# Patient Record
Sex: Female | Born: 1975 | Race: White | Hispanic: No | Marital: Married | Smoking: Never smoker
Health system: Southern US, Community
[De-identification: ages and names within clinical notes are randomized; demographics above are authoritative.]

## PROBLEM LIST (undated history)

## (undated) DIAGNOSIS — E039 Hypothyroidism, unspecified: Secondary | ICD-10-CM

## (undated) HISTORY — PX: CHOLECYSTECTOMY: SHX55

---

## 2019-04-06 ENCOUNTER — Emergency Department (HOSPITAL_COMMUNITY): Payer: BC Managed Care – PPO

## 2019-04-06 ENCOUNTER — Other Ambulatory Visit: Payer: Self-pay

## 2019-04-06 ENCOUNTER — Emergency Department (HOSPITAL_COMMUNITY): Payer: BC Managed Care – PPO | Admitting: Anesthesiology

## 2019-04-06 ENCOUNTER — Encounter (HOSPITAL_COMMUNITY): Payer: Self-pay

## 2019-04-06 ENCOUNTER — Inpatient Hospital Stay (HOSPITAL_COMMUNITY): Payer: BC Managed Care – PPO

## 2019-04-06 ENCOUNTER — Inpatient Hospital Stay (HOSPITAL_COMMUNITY)
Admission: EM | Admit: 2019-04-06 | Discharge: 2019-04-21 | DRG: 957 | Disposition: A | Discharge status: Home Health | Payer: BC Managed Care – PPO | Attending: Orthopedic Surgery | Admitting: Orthopedic Surgery

## 2019-04-06 ENCOUNTER — Encounter (HOSPITAL_COMMUNITY): Admission: EM | Disposition: A | Discharge status: Home Health | Payer: Self-pay | Source: Home / Self Care

## 2019-04-06 DIAGNOSIS — S3692XA Contusion of unspecified intra-abdominal organ, initial encounter: Secondary | ICD-10-CM | POA: Diagnosis present

## 2019-04-06 DIAGNOSIS — D62 Acute posthemorrhagic anemia: Secondary | ICD-10-CM | POA: Diagnosis present

## 2019-04-06 DIAGNOSIS — Z4659 Encounter for fitting and adjustment of other gastrointestinal appliance and device: Secondary | ICD-10-CM

## 2019-04-06 DIAGNOSIS — K661 Hemoperitoneum: Secondary | ICD-10-CM | POA: Diagnosis present

## 2019-04-06 DIAGNOSIS — S42022A Displaced fracture of shaft of left clavicle, initial encounter for closed fracture: Secondary | ICD-10-CM

## 2019-04-06 DIAGNOSIS — E039 Hypothyroidism, unspecified: Secondary | ICD-10-CM | POA: Diagnosis present

## 2019-04-06 DIAGNOSIS — F419 Anxiety disorder, unspecified: Secondary | ICD-10-CM | POA: Diagnosis present

## 2019-04-06 DIAGNOSIS — Z419 Encounter for procedure for purposes other than remedying health state, unspecified: Secondary | ICD-10-CM

## 2019-04-06 DIAGNOSIS — S270XXA Traumatic pneumothorax, initial encounter: Secondary | ICD-10-CM

## 2019-04-06 DIAGNOSIS — J939 Pneumothorax, unspecified: Secondary | ICD-10-CM

## 2019-04-06 DIAGNOSIS — S82142A Displaced bicondylar fracture of left tibia, initial encounter for closed fracture: Secondary | ICD-10-CM | POA: Diagnosis present

## 2019-04-06 DIAGNOSIS — F430 Acute stress reaction: Secondary | ICD-10-CM | POA: Diagnosis present

## 2019-04-06 DIAGNOSIS — G5730 Lesion of lateral popliteal nerve, unspecified lower limb: Secondary | ICD-10-CM | POA: Diagnosis not present

## 2019-04-06 DIAGNOSIS — Z20828 Contact with and (suspected) exposure to other viral communicable diseases: Secondary | ICD-10-CM | POA: Diagnosis present

## 2019-04-06 DIAGNOSIS — J90 Pleural effusion, not elsewhere classified: Secondary | ICD-10-CM

## 2019-04-06 DIAGNOSIS — J9690 Respiratory failure, unspecified, unspecified whether with hypoxia or hypercapnia: Secondary | ICD-10-CM

## 2019-04-06 DIAGNOSIS — S060X9A Concussion with loss of consciousness of unspecified duration, initial encounter: Secondary | ICD-10-CM | POA: Diagnosis present

## 2019-04-06 DIAGNOSIS — R109 Unspecified abdominal pain: Secondary | ICD-10-CM | POA: Diagnosis present

## 2019-04-06 DIAGNOSIS — M238X9 Other internal derangements of unspecified knee: Secondary | ICD-10-CM

## 2019-04-06 DIAGNOSIS — S2242XA Multiple fractures of ribs, left side, initial encounter for closed fracture: Secondary | ICD-10-CM | POA: Diagnosis present

## 2019-04-06 DIAGNOSIS — S36116A Major laceration of liver, initial encounter: Secondary | ICD-10-CM | POA: Diagnosis present

## 2019-04-06 DIAGNOSIS — R52 Pain, unspecified: Secondary | ICD-10-CM

## 2019-04-06 DIAGNOSIS — N390 Urinary tract infection, site not specified: Secondary | ICD-10-CM | POA: Diagnosis not present

## 2019-04-06 DIAGNOSIS — Z9689 Presence of other specified functional implants: Secondary | ICD-10-CM

## 2019-04-06 DIAGNOSIS — M21372 Foot drop, left foot: Secondary | ICD-10-CM | POA: Diagnosis not present

## 2019-04-06 DIAGNOSIS — Y92410 Unspecified street and highway as the place of occurrence of the external cause: Secondary | ICD-10-CM

## 2019-04-06 DIAGNOSIS — Z90721 Acquired absence of ovaries, unilateral: Secondary | ICD-10-CM | POA: Diagnosis not present

## 2019-04-06 DIAGNOSIS — T148XXA Other injury of unspecified body region, initial encounter: Secondary | ICD-10-CM

## 2019-04-06 DIAGNOSIS — K117 Disturbances of salivary secretion: Secondary | ICD-10-CM

## 2019-04-06 DIAGNOSIS — Z885 Allergy status to narcotic agent status: Secondary | ICD-10-CM | POA: Diagnosis not present

## 2019-04-06 DIAGNOSIS — E876 Hypokalemia: Secondary | ICD-10-CM | POA: Diagnosis present

## 2019-04-06 DIAGNOSIS — J9601 Acute respiratory failure with hypoxia: Secondary | ICD-10-CM | POA: Diagnosis present

## 2019-04-06 DIAGNOSIS — E8889 Other specified metabolic disorders: Secondary | ICD-10-CM | POA: Diagnosis present

## 2019-04-06 DIAGNOSIS — D72829 Elevated white blood cell count, unspecified: Secondary | ICD-10-CM

## 2019-04-06 DIAGNOSIS — S2249XA Multiple fractures of ribs, unspecified side, initial encounter for closed fracture: Secondary | ICD-10-CM | POA: Diagnosis present

## 2019-04-06 DIAGNOSIS — E559 Vitamin D deficiency, unspecified: Secondary | ICD-10-CM | POA: Diagnosis present

## 2019-04-06 DIAGNOSIS — M7989 Other specified soft tissue disorders: Secondary | ICD-10-CM | POA: Diagnosis not present

## 2019-04-06 DIAGNOSIS — M2480 Other specific joint derangements of unspecified joint, not elsewhere classified: Secondary | ICD-10-CM

## 2019-04-06 DIAGNOSIS — R58 Hemorrhage, not elsewhere classified: Secondary | ICD-10-CM

## 2019-04-06 HISTORY — PX: LAPAROTOMY: SHX154

## 2019-04-06 HISTORY — DX: Hypothyroidism, unspecified: E03.9

## 2019-04-06 LAB — LACTIC ACID, PLASMA
Lactic Acid, Venous: 2.4 mmol/L (ref 0.5–1.9)
Lactic Acid, Venous: 2.5 mmol/L (ref 0.5–1.9)

## 2019-04-06 LAB — COMPREHENSIVE METABOLIC PANEL
ALT: 528 U/L — ABNORMAL HIGH (ref 0–44)
AST: 518 U/L — ABNORMAL HIGH (ref 15–41)
Albumin: 3.7 g/dL (ref 3.5–5.0)
Alkaline Phosphatase: 61 U/L (ref 38–126)
Anion gap: 12 (ref 5–15)
BUN: 17 mg/dL (ref 6–20)
CO2: 21 mmol/L — ABNORMAL LOW (ref 22–32)
Calcium: 9.1 mg/dL (ref 8.9–10.3)
Chloride: 105 mmol/L (ref 98–111)
Creatinine, Ser: 0.84 mg/dL (ref 0.44–1.00)
GFR calc Af Amer: >60 mL/min (ref >60)
GFR calc non Af Amer: >60 mL/min (ref >60)
Glucose, Bld: 190 mg/dL — ABNORMAL HIGH (ref 70–99)
Potassium: 3.7 mmol/L (ref 3.5–5.1)
Sodium: 138 mmol/L (ref 135–145)
Total Bilirubin: 0.7 mg/dL (ref 0.3–1.2)
Total Protein: 6.2 g/dL — ABNORMAL LOW (ref 6.5–8.1)

## 2019-04-06 LAB — PREPARE FRESH FROZEN PLASMA
Unit division: 0
Unit division: 0
Unit division: 0
Unit division: 0
Unit division: 0
Unit division: 0
Unit division: 0
Unit division: 0

## 2019-04-06 LAB — BASIC METABOLIC PANEL
Anion gap: 7 (ref 5–15)
BUN: 13 mg/dL (ref 6–20)
CO2: 21 mmol/L — ABNORMAL LOW (ref 22–32)
Calcium: 7.6 mg/dL — ABNORMAL LOW (ref 8.9–10.3)
Chloride: 109 mmol/L (ref 98–111)
Creatinine, Ser: 0.56 mg/dL (ref 0.44–1.00)
GFR calc Af Amer: >60 mL/min (ref >60)
GFR calc non Af Amer: >60 mL/min (ref >60)
Glucose, Bld: 199 mg/dL — ABNORMAL HIGH (ref 70–99)
Potassium: 3.7 mmol/L (ref 3.5–5.1)
Sodium: 137 mmol/L (ref 135–145)

## 2019-04-06 LAB — URINALYSIS, ROUTINE W REFLEX MICROSCOPIC
Bacteria, UA: NONE SEEN
Bilirubin Urine: NEGATIVE
Glucose, UA: 150 mg/dL — AB
Ketones, ur: NEGATIVE mg/dL
Leukocytes,Ua: NEGATIVE
Nitrite: NEGATIVE
Protein, ur: NEGATIVE mg/dL
Specific Gravity, Urine: 1.016 (ref 1.005–1.030)
pH: 8 (ref 5.0–8.0)

## 2019-04-06 LAB — SARS CORONAVIRUS 2 BY RT PCR (HOSPITAL ORDER, PERFORMED IN ~~LOC~~ HOSPITAL LAB): SARS Coronavirus 2: NEGATIVE

## 2019-04-06 LAB — I-STAT CHEM 8, ED
BUN: 20 mg/dL (ref 6–20)
Calcium, Ion: 1.19 mmol/L (ref 1.15–1.40)
Chloride: 106 mmol/L (ref 98–111)
Creatinine, Ser: 0.7 mg/dL (ref 0.44–1.00)
Glucose, Bld: 179 mg/dL — ABNORMAL HIGH (ref 70–99)
HCT: 40 % (ref 36.0–46.0)
Hemoglobin: 13.6 g/dL (ref 12.0–15.0)
Potassium: 3.7 mmol/L (ref 3.5–5.1)
Sodium: 140 mmol/L (ref 135–145)
TCO2: 24 mmol/L (ref 22–32)

## 2019-04-06 LAB — CBC
HCT: 39.8 % (ref 36.0–46.0)
HCT: 44 % (ref 36.0–46.0)
HCT: 44.4 % (ref 36.0–46.0)
HCT: 41.5 % (ref 36.0–46.0)
Hemoglobin: 13.7 g/dL (ref 12.0–15.0)
Hemoglobin: 15 g/dL (ref 12.0–15.0)
Hemoglobin: 15.4 g/dL — ABNORMAL HIGH (ref 12.0–15.0)
Hemoglobin: 14 g/dL (ref 12.0–15.0)
MCH: 31.6 pg (ref 26.0–34.0)
MCH: 30.7 pg (ref 26.0–34.0)
MCH: 31.2 pg (ref 26.0–34.0)
MCH: 31 pg (ref 26.0–34.0)
MCHC: 34.4 g/dL (ref 30.0–36.0)
MCHC: 34.1 g/dL (ref 30.0–36.0)
MCHC: 34.7 g/dL (ref 30.0–36.0)
MCHC: 33.7 g/dL (ref 30.0–36.0)
MCV: 91.9 fL (ref 80.0–100.0)
MCV: 90 fL (ref 80.0–100.0)
MCV: 90.1 fL (ref 80.0–100.0)
MCV: 92 fL (ref 80.0–100.0)
Platelets: 404 10*3/uL — ABNORMAL HIGH (ref 150–400)
Platelets: 158 10*3/uL (ref 150–400)
Platelets: 203 10*3/uL (ref 150–400)
Platelets: 225 10*3/uL (ref 150–400)
RBC: 4.33 MIL/uL (ref 3.87–5.11)
RBC: 4.89 MIL/uL (ref 3.87–5.11)
RBC: 4.93 MIL/uL (ref 3.87–5.11)
RBC: 4.51 MIL/uL (ref 3.87–5.11)
RDW: 12.2 % (ref 11.5–15.5)
RDW: 12.7 % (ref 11.5–15.5)
RDW: 13 % (ref 11.5–15.5)
RDW: 13.4 % (ref 11.5–15.5)
WBC: 11.1 10*3/uL — ABNORMAL HIGH (ref 4.0–10.5)
WBC: 11.1 10*3/uL — ABNORMAL HIGH (ref 4.0–10.5)
WBC: 12.9 10*3/uL — ABNORMAL HIGH (ref 4.0–10.5)
WBC: 13.7 10*3/uL — ABNORMAL HIGH (ref 4.0–10.5)
nRBC: 0 % (ref 0.0–0.2)
nRBC: 0 % (ref 0.0–0.2)
nRBC: 0 % (ref 0.0–0.2)
nRBC: 0 % (ref 0.0–0.2)

## 2019-04-06 LAB — POCT I-STAT 7, (LYTES, BLD GAS, ICA,H+H)
Acid-base deficit: 4 mmol/L — ABNORMAL HIGH (ref 0.0–2.0)
Acid-base deficit: 6 mmol/L — ABNORMAL HIGH (ref 0.0–2.0)
Bicarbonate: 22.7 mmol/L (ref 20.0–28.0)
Bicarbonate: 20.1 mmol/L (ref 20.0–28.0)
Calcium, Ion: 1.07 mmol/L — ABNORMAL LOW (ref 1.15–1.40)
Calcium, Ion: 1.2 mmol/L (ref 1.15–1.40)
HCT: 36 % (ref 36.0–46.0)
HCT: 46 % (ref 36.0–46.0)
Hemoglobin: 12.2 g/dL (ref 12.0–15.0)
Hemoglobin: 15.6 g/dL — ABNORMAL HIGH (ref 12.0–15.0)
O2 Saturation: 100 %
O2 Saturation: 99 %
Patient temperature: 35
Patient temperature: 34.1
Potassium: 4 mmol/L (ref 3.5–5.1)
Potassium: 3.5 mmol/L (ref 3.5–5.1)
Sodium: 140 mmol/L (ref 135–145)
Sodium: 140 mmol/L (ref 135–145)
TCO2: 24 mmol/L (ref 22–32)
TCO2: 21 mmol/L — ABNORMAL LOW (ref 22–32)
pCO2 arterial: 43.3 mmHg (ref 32.0–48.0)
pCO2 arterial: 34.9 mmHg (ref 32.0–48.0)
pH, Arterial: 7.318 — ABNORMAL LOW (ref 7.350–7.450)
pH, Arterial: 7.356 (ref 7.350–7.450)
pO2, Arterial: 522 mmHg — ABNORMAL HIGH (ref 83.0–108.0)
pO2, Arterial: 134 mmHg — ABNORMAL HIGH (ref 83.0–108.0)

## 2019-04-06 LAB — BPAM FFP
Blood Product Expiration Date: 202008312359
Blood Product Expiration Date: 202008312359
Blood Product Expiration Date: 202009012359
Blood Product Expiration Date: 202009012359
Blood Product Expiration Date: 202009012359
Blood Product Expiration Date: 202009012359
Blood Product Expiration Date: 202009012359
Blood Product Expiration Date: 202009012359
ISSUE DATE / TIME: 202008300125
ISSUE DATE / TIME: 202008300314
ISSUE DATE / TIME: 202008300314
ISSUE DATE / TIME: 202008301434
ISSUE DATE / TIME: 202008301505
ISSUE DATE / TIME: 202008301629
Unit Type and Rh: 600
Unit Type and Rh: 600
Unit Type and Rh: 6200
Unit Type and Rh: 6200
Unit Type and Rh: 6200
Unit Type and Rh: 6200
Unit Type and Rh: 6200
Unit Type and Rh: 6200

## 2019-04-06 LAB — I-STAT BETA HCG BLOOD, ED (MC, WL, AP ONLY): I-stat hCG, quantitative: <5 m[IU]/mL (ref <5)

## 2019-04-06 LAB — PROTIME-INR
INR: 1 (ref 0.8–1.2)
INR: 1.2 (ref 0.8–1.2)
Prothrombin Time: 13.3 seconds (ref 11.4–15.2)
Prothrombin Time: 14.7 s (ref 11.4–15.2)

## 2019-04-06 LAB — ABO/RH: ABO/RH(D): O POS

## 2019-04-06 LAB — ETHANOL: Alcohol, Ethyl (B): <10 mg/dL (ref <10)

## 2019-04-06 LAB — CDS SEROLOGY

## 2019-04-06 LAB — MRSA PCR SCREENING: MRSA by PCR: NEGATIVE

## 2019-04-06 LAB — HIV ANTIBODY (ROUTINE TESTING W REFLEX): HIV Screen 4th Generation wRfx: NONREACTIVE

## 2019-04-06 SURGERY — LAPAROTOMY, EXPLORATORY
Anesthesia: General | Site: Abdomen

## 2019-04-06 MED ORDER — FENTANYL CITRATE (PF) 100 MCG/2ML IJ SOLN: 50.0000 ug | INTRAMUSCULAR | Status: DC | PRN

## 2019-04-06 MED ORDER — PHENYLEPHRINE HCL-NACL 10-0.9 MG/250ML-% IV SOLN
25.0000 ug/min | INTRAVENOUS | Status: DC
  Administered 2019-04-06: 75 ug/min via INTRAVENOUS
  Administered 2019-04-06: 23:00:00 70 ug/min via INTRAVENOUS
  Administered 2019-04-06: 25 ug/min via INTRAVENOUS
  Administered 2019-04-07: 50 ug/min via INTRAVENOUS
  Administered 2019-04-07 (×2): 60 ug/min via INTRAVENOUS
  Administered 2019-04-07: 40 ug/min via INTRAVENOUS
  Filled 2019-04-06 (×7): qty 250

## 2019-04-06 MED ORDER — ONDANSETRON 4 MG PO TBDP
4.0000 mg | ORAL_TABLET | Freq: Four times a day (QID) | ORAL | Status: DC | PRN
  Administered 2019-04-21: 4 mg via ORAL
  Filled 2019-04-06: qty 1

## 2019-04-06 MED ORDER — SODIUM CHLORIDE 0.9 % IV BOLUS
1000.0000 mL | Freq: Once | INTRAVENOUS | Status: AC
  Administered 2019-04-06: 1000 mL via INTRAVENOUS

## 2019-04-06 MED ORDER — MIDAZOLAM HCL 2 MG/2ML IJ SOLN
INTRAMUSCULAR | Status: AC
  Filled 2019-04-06: qty 2

## 2019-04-06 MED ORDER — ONDANSETRON HCL 4 MG/2ML IJ SOLN
4.0000 mg | Freq: Four times a day (QID) | INTRAMUSCULAR | Status: DC | PRN
  Administered 2019-04-16 – 2019-04-21 (×4): 4 mg via INTRAVENOUS
  Filled 2019-04-06 (×4): qty 2

## 2019-04-06 MED ORDER — SODIUM CHLORIDE 0.9 % IV SOLN
INTRAVENOUS | Status: DC | PRN
  Administered 2019-04-06: 04:00:00 via INTRAVENOUS

## 2019-04-06 MED ORDER — ORAL CARE MOUTH RINSE
15.0000 mL | OROMUCOSAL | Status: DC
  Administered 2019-04-06 – 2019-04-16 (×100): 15 mL via OROMUCOSAL

## 2019-04-06 MED ORDER — KETAMINE HCL 10 MG/ML IJ SOLN: 1.0000 mg/kg | Freq: Once | INTRAMUSCULAR | Status: DC

## 2019-04-06 MED ORDER — LACTATED RINGERS IV SOLN
INTRAVENOUS | Status: DC
  Administered 2019-04-06 – 2019-04-13 (×15): via INTRAVENOUS

## 2019-04-06 MED ORDER — PROPOFOL 10 MG/ML IV BOLUS
INTRAVENOUS | Status: AC
  Filled 2019-04-06: qty 40

## 2019-04-06 MED ORDER — PROPOFOL 10 MG/ML IV BOLUS
INTRAVENOUS | Status: DC | PRN
  Administered 2019-04-06: 150 mg via INTRAVENOUS

## 2019-04-06 MED ORDER — CHLORHEXIDINE GLUCONATE CLOTH 2 % EX PADS: 6.0000 | MEDICATED_PAD | Freq: Every day | CUTANEOUS | Status: DC

## 2019-04-06 MED ORDER — BISACODYL 10 MG RE SUPP
10.0000 mg | Freq: Every day | RECTAL | Status: DC | PRN
  Administered 2019-04-11 – 2019-04-14 (×2): 10 mg via RECTAL
  Filled 2019-04-06 (×2): qty 1

## 2019-04-06 MED ORDER — CHLORHEXIDINE GLUCONATE 0.12% ORAL RINSE (MEDLINE KIT)
15.0000 mL | Freq: Two times a day (BID) | OROMUCOSAL | Status: DC
  Administered 2019-04-06 – 2019-04-20 (×28): 15 mL via OROMUCOSAL

## 2019-04-06 MED ORDER — LACTATED RINGERS IV SOLN
INTRAVENOUS | Status: DC | PRN
  Administered 2019-04-06 (×2): via INTRAVENOUS

## 2019-04-06 MED ORDER — PROPOFOL 1000 MG/100ML IV EMUL
INTRAVENOUS | Status: AC
  Filled 2019-04-06: qty 100

## 2019-04-06 MED ORDER — FENTANYL 2500MCG IN NS 250ML (10MCG/ML) PREMIX INFUSION
0.0000 ug/h | INTRAVENOUS | Status: DC
  Administered 2019-04-06: 25 ug/h via INTRAVENOUS
  Administered 2019-04-06: 200 ug/h via INTRAVENOUS
  Administered 2019-04-07: 250 ug/h via INTRAVENOUS
  Administered 2019-04-08: 100 ug/h via INTRAVENOUS
  Filled 2019-04-06 (×5): qty 250

## 2019-04-06 MED ORDER — IOHEXOL 300 MG/ML  SOLN
100.0000 mL | Freq: Once | INTRAMUSCULAR | Status: AC | PRN
  Administered 2019-04-06: 100 mL via INTRAVENOUS

## 2019-04-06 MED ORDER — PROPOFOL 1000 MG/100ML IV EMUL
0.0000 ug/kg/min | INTRAVENOUS | Status: DC
  Administered 2019-04-06: 20 ug/kg/min via INTRAVENOUS
  Administered 2019-04-06: 35 ug/kg/min via INTRAVENOUS
  Administered 2019-04-06: 40 ug/kg/min via INTRAVENOUS
  Administered 2019-04-07: 16:00:00 via INTRAVENOUS
  Administered 2019-04-07: 30 ug/kg/min via INTRAVENOUS
  Administered 2019-04-07: 50 ug/kg/min via INTRAVENOUS
  Administered 2019-04-07 (×2): 40 ug/kg/min via INTRAVENOUS
  Administered 2019-04-08: 04:00:00 35 ug/kg/min via INTRAVENOUS
  Filled 2019-04-06 (×8): qty 100

## 2019-04-06 MED ORDER — FENTANYL CITRATE (PF) 250 MCG/5ML IJ SOLN
INTRAMUSCULAR | Status: DC | PRN
  Administered 2019-04-06: 100 ug via INTRAVENOUS
  Administered 2019-04-06: 50 ug via INTRAVENOUS
  Administered 2019-04-06: 100 ug via INTRAVENOUS

## 2019-04-06 MED ORDER — PHENYLEPHRINE HCL (PRESSORS) 10 MG/ML IV SOLN
INTRAVENOUS | Status: DC | PRN
  Administered 2019-04-06 (×2): 120 ug via INTRAVENOUS

## 2019-04-06 MED ORDER — LACTATED RINGERS IV SOLN
INTRAVENOUS | Status: DC | PRN
  Administered 2019-04-06: 03:00:00 via INTRAVENOUS

## 2019-04-06 MED ORDER — ROCURONIUM BROMIDE 100 MG/10ML IV SOLN
INTRAVENOUS | Status: DC | PRN
  Administered 2019-04-06: 40 mg via INTRAVENOUS
  Administered 2019-04-06: 50 mg via INTRAVENOUS
  Administered 2019-04-06: 60 mg via INTRAVENOUS

## 2019-04-06 MED ORDER — MIDAZOLAM HCL 5 MG/5ML IJ SOLN
INTRAMUSCULAR | Status: DC | PRN
  Administered 2019-04-06: 2 mg via INTRAVENOUS

## 2019-04-06 MED ORDER — KETAMINE HCL 50 MG/5ML IJ SOSY
PREFILLED_SYRINGE | INTRAMUSCULAR | Status: AC
  Administered 2019-04-06: 50 mg
  Filled 2019-04-06: qty 5

## 2019-04-06 MED ORDER — FENTANYL CITRATE (PF) 250 MCG/5ML IJ SOLN
INTRAMUSCULAR | Status: AC
  Filled 2019-04-06: qty 5

## 2019-04-06 MED ORDER — CHLORHEXIDINE GLUCONATE CLOTH 2 % EX PADS
6.0000 | MEDICATED_PAD | Freq: Every day | CUTANEOUS | Status: DC
  Administered 2019-04-06 – 2019-04-20 (×12): 6 via TOPICAL

## 2019-04-06 MED ORDER — FENTANYL CITRATE (PF) 100 MCG/2ML IJ SOLN
INTRAMUSCULAR | Status: AC
  Administered 2019-04-06: 50 ug
  Filled 2019-04-06: qty 2

## 2019-04-06 MED ORDER — PANTOPRAZOLE SODIUM 40 MG IV SOLR
40.0000 mg | INTRAVENOUS | Status: DC
  Administered 2019-04-06 – 2019-04-08 (×3): 40 mg via INTRAVENOUS
  Filled 2019-04-06 (×3): qty 40

## 2019-04-06 MED ORDER — SUCCINYLCHOLINE CHLORIDE 20 MG/ML IJ SOLN
INTRAMUSCULAR | Status: DC | PRN
  Administered 2019-04-06: 140 mg via INTRAVENOUS

## 2019-04-06 MED ORDER — DOCUSATE SODIUM 50 MG/5ML PO LIQD
100.0000 mg | Freq: Two times a day (BID) | ORAL | Status: DC | PRN
  Administered 2019-04-11 – 2019-04-12 (×3): 100 mg
  Filled 2019-04-06 (×3): qty 10

## 2019-04-06 MED ORDER — SODIUM CHLORIDE 0.9 % IV SOLN
250.0000 mL | INTRAVENOUS | Status: DC
  Administered 2019-04-06: 250 mL via INTRAVENOUS

## 2019-04-06 MED ORDER — CEFAZOLIN SODIUM-DEXTROSE 1-4 GM/50ML-% IV SOLN
INTRAVENOUS | Status: DC | PRN
  Administered 2019-04-06: 2 g via INTRAVENOUS

## 2019-04-06 SURGICAL SUPPLY — 39 items
BENZOIN TINCTURE PRP APPL 2/3 (GAUZE/BANDAGES/DRESSINGS) ×4 IMPLANT
BLADE CLIPPER SURG (BLADE) ×2 IMPLANT
CANISTER SUCT 3000ML PPV (MISCELLANEOUS) ×2 IMPLANT
CATH THORACIC 36FR (CATHETERS) ×2 IMPLANT
COVER SURGICAL LIGHT HANDLE (MISCELLANEOUS) ×2 IMPLANT
DRAPE LAPAROSCOPIC ABDOMINAL (DRAPES) ×2 IMPLANT
DRAPE WARM FLUID 44X44 (DRAPES) ×2 IMPLANT
DRSG OPSITE POSTOP 4X8 (GAUZE/BANDAGES/DRESSINGS) IMPLANT
ELECT CAUTERY BLADE 6.4 (BLADE) ×4 IMPLANT
ELECT REM PT RETURN 9FT ADLT (ELECTROSURGICAL) ×2
ELECTRODE REM PT RTRN 9FT ADLT (ELECTROSURGICAL) ×1 IMPLANT
GAUZE SPONGE 4X4 12PLY STRL LF (GAUZE/BANDAGES/DRESSINGS) ×2 IMPLANT
GLOVE BIO SURGEON STRL SZ7 (GLOVE) ×6 IMPLANT
GLOVE BIOGEL PI IND STRL 7.5 (GLOVE) ×2 IMPLANT
GLOVE BIOGEL PI INDICATOR 7.5 (GLOVE) ×2
GOWN STRL REUS W/ TWL LRG LVL3 (GOWN DISPOSABLE) ×2 IMPLANT
GOWN STRL REUS W/TWL LRG LVL3 (GOWN DISPOSABLE) ×2
HANDLE SUCTION POOLE (INSTRUMENTS) ×1 IMPLANT
KIT BASIN OR (CUSTOM PROCEDURE TRAY) ×2 IMPLANT
KIT TURNOVER KIT B (KITS) ×2 IMPLANT
NS IRRIG 1000ML POUR BTL (IV SOLUTION) ×6 IMPLANT
PACK GENERAL/GYN (CUSTOM PROCEDURE TRAY) ×2 IMPLANT
PAD ARMBOARD 7.5X6 YLW CONV (MISCELLANEOUS) ×2 IMPLANT
PENCIL SMOKE EVACUATOR (MISCELLANEOUS) ×2 IMPLANT
SPECIMEN JAR LARGE (MISCELLANEOUS) IMPLANT
SPONGE ABD ABTHERA ADVANCE (MISCELLANEOUS) ×2 IMPLANT
SPONGE LAP 18X18 RF (DISPOSABLE) ×2 IMPLANT
STAPLER VISISTAT 35W (STAPLE) ×2 IMPLANT
SUCTION POOLE HANDLE (INSTRUMENTS) ×2
SUT ETHILON 2 0 FS 18 (SUTURE) ×2 IMPLANT
SUT PDS AB 3-0 SH 27 (SUTURE) ×6 IMPLANT
SUT SILK 2 0 SH CR/8 (SUTURE) ×2 IMPLANT
SUT SILK 2 0 TIES 10X30 (SUTURE) ×2 IMPLANT
SUT SILK 3 0 SH CR/8 (SUTURE) ×2 IMPLANT
SUT SILK 3 0 TIES 10X30 (SUTURE) ×2 IMPLANT
TOWEL GREEN STERILE (TOWEL DISPOSABLE) ×2 IMPLANT
TRAY FOLEY MTR SLVR 16FR STAT (SET/KITS/TRAYS/PACK) ×2 IMPLANT
TUBE CONNECTING 20X1/4 (TUBING) ×2 IMPLANT
YANKAUER SUCT BULB TIP NO VENT (SUCTIONS) IMPLANT

## 2019-04-06 NOTE — ED Notes (Signed)
MD Tseui at bedside placing chest tube to L chest
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
## 2019-04-06 NOTE — Transfer of Care (Signed)
Immediate Anesthesia Transfer of Care Note  Patient: Regina Edwards  Procedure(s) Performed: EXPLORATORY LAPAROTOMY, REPAIR STOMACH LACERATION, AND LIVER PACKING (N/A Abdomen)  Patient Location: ICU  Anesthesia Type:General  Level of Consciousness: sedated and Patient remains intubated per anesthesia plan  Airway & Oxygen Therapy: Patient remains intubated per anesthesia plan and Patient placed on Ventilator (see vital sign flow sheet for setting)  Post-op Assessment: Report given to RN and Post -op Vital signs reviewed and stable  Post vital signs: Reviewed and stable  Last Vitals:  Vitals Value Taken Time  BP 106/89 04/06/19 0512  Temp    Pulse 87 04/06/19 0517  Resp 15 04/06/19 0517  SpO2 100 % 04/06/19 0517  Vitals shown include unvalidated device data.  Last Pain:  Vitals:   04/06/19 0130  TempSrc: Temporal  PainSc: 10-Worst pain ever         Complications: No apparent anesthesia complications

## 2019-04-06 NOTE — Consult Note (Signed)
Ortho Trauma Note  Consult acknowledged. Will see patient later today vs tomorrow. Would likely benefit from ORIF of clavicle but does not need to be done on urgent basis. Will coordinate with trauma team after formal consult performed.  Shona Needles, MD [facility] [phone] (phone) [phone] (office) [email or web address]

## 2019-04-06 NOTE — Op Note (Signed)
Preoperative diagnosis: Hemoperitoneum, liver laceration  Postoperative diagnosis:  1. Hemoperitoneum 2. Liver laceration 3. Gastric injury  Procedure: 1. Exploratory laparotomy  2. Repair of gastric injury 3. Packing of liver 4. Placement of temporary abdominal closure device  Surgeon: Sharon Mt. Dema Severin, MD.  Assistant: Verita Lamb, MD  Anesthesia: General endotracheal  Findings: Liver laceration around falciform that extended out to capsular surface with associated 2L hemoperitoneum. The falciform appeared to have been partially sheared off going into the hilum of the liver. 3 mm hole in stomach which was full thickness. Pigtail chest tube was intraperitoneal but not in the lumen of stomach at time of exploration. This was repaired primarily. She received 3U PRBC during procedure; liver injury continued to ooze rather diffusely when packs were removed; for these reasons the liver was packed and the abdomen left open. The liver appeared hemostatic after packing.  3 laparotomy sponges were placed for packing of the liver.  The abdomen is left open with a ABThera.  Complications: None  Specimen: None  EBL: 2L on entry, 300 additional during case  Drain: None; ab  Counts: Sponge, needle and instrument counts were reported correct x2 at conclusion of the operation.  Indications: Regina Edwards is a 43 y.o. female presented to the trauma bay as a level 1 activation.   Narrative: The patient was brought into the operating room, placed supine on the operating table and sequential compression garments were applied and confirmed to be working. General anesthesia was administered. The patient was prepped and draped in the standard sterile fashion. Preoperative antibiotics were administered on arrival to OR. A timeout was performed indicating the correct patient, planned procedure and likelihood of needing blood products.  She had a pigtail catheter already in place in her left chest that  appeared to have been placed in an appropriate location-inframammary fold in a female in the mid axillary line.  Given the findings on x-ray as well as the output, began by placing a 36 French left chest tube.  Lateral to the nipple in the mid axillary line a skin incision was made.  Using blunt dissection the chest wall was identified and entering the pleura was entered bluntly with a Claiborne Billings just above the rib.  Pleural spread.  The finger was inserted in the chest cavity and the lung was palpable.  I confirmed this was actually the lung by having anesthesia provide positive pressure breaths.  72 French chest tube was placed and connected to suction.  The chest tube was secured with a 2-0 nylon suture.  A midline laparotomy was made and the fascia exposed and incised. The peritoneal cavity was entered. Clots were evacuated. The small bowel was eviscerated. Laparotomy pads were placed around the liver. Each quadrant was systematically explored.  The supramesocolic compartment was explored. The transverse colon was reflected caudad. The diaphragm appeared normal; there was no bulging or visible injury identified. Time was allowed for anesthesia to resuscitate the patient. Following this, the liver was inspected.  The falciform appeared to have been partially avulsed from her injury with her liver laceration extending out to the capsular surface.  There was diffuse raw surface oozing and bleeding present.  This would stop when the liver was packed.  Despite application of electrocautery, this continued and was rather diffuse.  The spleen was palpated and inspected and appeared normal and smooth. The stomach from the EG junction to the first portion of the duodenum was inspected and palpated.  A 3 mm hole was identified  in the lateral portion of the gastric body.  This was full-thickness.  This was repaired in 2 layers with 3-0 PDS suture.  There is also a 2 cm serosal tear adjacent to this which was repaired.   There not appear to be any backwall injury or lateral injury along the lesser curvature.  The lateral portion of the duodenum was visible with retraction of the hepatic flexure medially.  The duodenum appeared normal without any associated hematoma or staining in the retroperitoneum at this location.  The duodenum evaluated out to the ligament of Treitz - no identifiable injury was found. Both kidneys were palpated and grossly normal.  Zones I and II of the retroperitoneum were inspected and no hematoma was identified.  Attention was then turned to the inframesocolic compartment. The transverse colon was reflected cephalad. The small bowel and associated mesentery was nspected and palpated from the ligament of Treitz to the level of the cecum. The colon, appendix and associated mesentery from the cecum to the rectosigmoid colon was inspected and palpated. The pelvis and the rectum was examined down to the peritoneal reflection. The bladder was inspected. The uterus was normal and soft. There was no zone III hematoma identified.  The abdomen was then irrigated with sterile saline. Hemostasis was confirmed.  3 laparotomy sponges remained packed around the liver.  The liver was hemostatic on reinspection with the sponges in place. An ABThera VAC dressing was applied.  This was connected to suction and a good seal was achieved.  She was left intubated by anesthesia and transported to the intensive care unit in critical but stable condition.  Disposition: SICU
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
## 2019-04-06 NOTE — Anesthesia Procedure Notes (Signed)
Procedure Name: Intubation Date/Time: 04/06/2019 3:23 AM Performed by: Clovis Cao, CRNA Pre-anesthesia Checklist: Patient identified, Emergency Drugs available, Suction available, Patient being monitored and Timeout performed Patient Re-evaluated:Patient Re-evaluated prior to induction Oxygen Delivery Method: Circle system utilized Preoxygenation: Pre-oxygenation with 100% oxygen Induction Type: IV induction, Rapid sequence and Cricoid Pressure applied Laryngoscope Size: Glidescope Grade View: Grade I Tube type: Oral Tube size: 7.5 mm Number of attempts: 1 Airway Equipment and Method: Video-laryngoscopy and Stylet Placement Confirmation: ETT inserted through vocal cords under direct vision,  positive ETCO2 and breath sounds checked- equal and bilateral Secured at: 22 cm Tube secured with: Tape Dental Injury: Teeth and Oropharynx as per pre-operative assessment

## 2019-04-06 NOTE — Anesthesia Preprocedure Evaluation (Signed)
Anesthesia Evaluation  Patient identified by MRN, date of birth, ID band Patient awake    Reviewed: Allergy & Precautions, NPO status , Patient's Chart, lab work & pertinent test results  Airway Mallampati: II  TM Distance: >3 FB Neck ROM: Limited    Dental  (+) Dental Advisory Given   Pulmonary neg pulmonary ROS,    Pulmonary exam normal breath sounds clear to auscultation       Cardiovascular negative cardio ROS Normal cardiovascular exam Rhythm:Regular Rate:Normal     Neuro/Psych negative neurological ROS  negative psych ROS   GI/Hepatic negative GI ROS, Neg liver ROS,   Endo/Other  Hypothyroidism   Renal/GU negative Renal ROS     Musculoskeletal negative musculoskeletal ROS (+)   Abdominal   Peds  Hematology negative hematology ROS (+)   Anesthesia Other Findings   Reproductive/Obstetrics negative OB ROS                             Anesthesia Physical Anesthesia Plan  ASA: II and emergent  Anesthesia Plan: General   Post-op Pain Management:    Induction: Intravenous, Rapid sequence and Cricoid pressure planned  PONV Risk Score and Plan: 4 or greater and Ondansetron, Dexamethasone and Treatment may vary due to age or medical condition  Airway Management Planned: Oral ETT  Additional Equipment: Arterial line  Intra-op Plan:   Post-operative Plan: Extubation in OR  Informed Consent: I have reviewed the patients History and Physical, chart, labs and discussed the procedure including the risks, benefits and alternatives for the proposed anesthesia with the patient or authorized representative who has indicated his/her understanding and acceptance.     Dental advisory given  Plan Discussed with: CRNA  Anesthesia Plan Comments:         Anesthesia Quick Evaluation

## 2019-04-06 NOTE — Progress Notes (Signed)
RT pulled back ETT by 2cm per MD order pt was at 23 pt now at 21. RT will continue to monitor.

## 2019-04-06 NOTE — Progress Notes (Signed)
RT obtained ABG on pt post surgical intubation with the following results. No changes needed at this time. RT will continue to monitor.   Results for Regina Edwards, Regina Edwards (MRN [id number]) as of 04/06/2019 06:38  Ref. Range 04/06/2019 06:31  Sample type Unknown ARTERIAL  pH, Arterial Latest Ref Range: 7.350 - 7.450  7.356  pCO2 arterial Latest Ref Range: 32.0 - 48.0 mmHg 34.9  pO2, Arterial Latest Ref Range: 83.0 - 108.0 mmHg 134.0 (H)  TCO2 Latest Ref Range: 22 - 32 mmol/L 21 (L)  Acid-base deficit Latest Ref Range: 0.0 - 2.0 mmol/L 6.0 (H)  Bicarbonate Latest Ref Range: 20.0 - 28.0 mmol/L 20.1  O2 Saturation Latest Units: % 99.0  Patient temperature Unknown 34.1 C  Collection site Unknown ARTERIAL LINE
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
## 2019-04-06 NOTE — Anesthesia Procedure Notes (Addendum)
Arterial Line Insertion Start/End8/30/2020 3:45 AM, 04/06/2019 3:56 AM Performed by: Nolon Nations, MD, attending  Preanesthetic checklist: patient identified, IV checked, monitors and equipment checked and timeout performed radial was placed Catheter size: 20 G Maximum sterile barriers used   Attempts: 1 Procedure performed using ultrasound guided technique. Ultrasound Notes:anatomy identified, needle tip was noted to be adjacent to the nerve/plexus identified, no ultrasound evidence of intravascular and/or intraneural injection and image(s) printed for medical record Following insertion, Biopatch and dressing applied. Post procedure assessment: normal  Patient tolerated the procedure well with no immediate complications.

## 2019-04-07 ENCOUNTER — Inpatient Hospital Stay (HOSPITAL_COMMUNITY): Payer: BC Managed Care – PPO

## 2019-04-07 ENCOUNTER — Encounter (HOSPITAL_COMMUNITY): Admission: EM | Disposition: A | Discharge status: Home Health | Payer: Self-pay | Source: Home / Self Care

## 2019-04-07 ENCOUNTER — Inpatient Hospital Stay (HOSPITAL_COMMUNITY): Payer: BC Managed Care – PPO | Admitting: Anesthesiology

## 2019-04-07 ENCOUNTER — Encounter (HOSPITAL_COMMUNITY): Payer: Self-pay | Admitting: Surgery

## 2019-04-07 HISTORY — PX: LAPAROTOMY: SHX154

## 2019-04-07 HISTORY — PX: EXTERNAL FIXATION LEG: SHX1549

## 2019-04-07 HISTORY — PX: ORIF CLAVICULAR FRACTURE: SHX5055

## 2019-04-07 LAB — POCT I-STAT 7, (LYTES, BLD GAS, ICA,H+H)
Acid-base deficit: 3 mmol/L — ABNORMAL HIGH (ref 0.0–2.0)
Acid-base deficit: 3 mmol/L — ABNORMAL HIGH (ref 0.0–2.0)
Bicarbonate: 23.4 mmol/L (ref 20.0–28.0)
Bicarbonate: 21.3 mmol/L (ref 20.0–28.0)
Calcium, Ion: 1.18 mmol/L (ref 1.15–1.40)
Calcium, Ion: 1.17 mmol/L (ref 1.15–1.40)
HCT: 23 % — ABNORMAL LOW (ref 36.0–46.0)
HCT: 34 % — ABNORMAL LOW (ref 36.0–46.0)
Hemoglobin: 7.8 g/dL — ABNORMAL LOW (ref 12.0–15.0)
Hemoglobin: 11.6 g/dL — ABNORMAL LOW (ref 12.0–15.0)
O2 Saturation: 100 %
O2 Saturation: 99 %
Patient temperature: 35
Patient temperature: 38
Potassium: 3.5 mmol/L (ref 3.5–5.1)
Potassium: 3.5 mmol/L (ref 3.5–5.1)
Sodium: 141 mmol/L (ref 135–145)
Sodium: 141 mmol/L (ref 135–145)
TCO2: 25 mmol/L (ref 22–32)
TCO2: 22 mmol/L (ref 22–32)
pCO2 arterial: 41.9 mmHg (ref 32.0–48.0)
pCO2 arterial: 37.5 mmHg (ref 32.0–48.0)
pH, Arterial: 7.345 — ABNORMAL LOW (ref 7.350–7.450)
pH, Arterial: 7.368 (ref 7.350–7.450)
pO2, Arterial: 532 mmHg — ABNORMAL HIGH (ref 83.0–108.0)
pO2, Arterial: 168 mmHg — ABNORMAL HIGH (ref 83.0–108.0)

## 2019-04-07 LAB — BLOOD PRODUCT ORDER (VERBAL) VERIFICATION

## 2019-04-07 LAB — BASIC METABOLIC PANEL
Anion gap: 4 — ABNORMAL LOW (ref 5–15)
BUN: 9 mg/dL (ref 6–20)
CO2: 21 mmol/L — ABNORMAL LOW (ref 22–32)
Calcium: 7.1 mg/dL — ABNORMAL LOW (ref 8.9–10.3)
Chloride: 112 mmol/L — ABNORMAL HIGH (ref 98–111)
Creatinine, Ser: 0.57 mg/dL (ref 0.44–1.00)
GFR calc Af Amer: >60 mL/min (ref >60)
GFR calc non Af Amer: >60 mL/min (ref >60)
Glucose, Bld: 106 mg/dL — ABNORMAL HIGH (ref 70–99)
Potassium: 3.4 mmol/L — ABNORMAL LOW (ref 3.5–5.1)
Sodium: 137 mmol/L (ref 135–145)

## 2019-04-07 LAB — SURGICAL PCR SCREEN
MRSA, PCR: NEGATIVE
Staphylococcus aureus: NEGATIVE

## 2019-04-07 LAB — CBC
HCT: 36.7 % (ref 36.0–46.0)
HCT: 35.7 % — ABNORMAL LOW (ref 36.0–46.0)
HCT: 36.6 % (ref 36.0–46.0)
Hemoglobin: 12.5 g/dL (ref 12.0–15.0)
Hemoglobin: 12.2 g/dL (ref 12.0–15.0)
Hemoglobin: 12 g/dL (ref 12.0–15.0)
MCH: 31.4 pg (ref 26.0–34.0)
MCH: 31.3 pg (ref 26.0–34.0)
MCH: 31.1 pg (ref 26.0–34.0)
MCHC: 34.1 g/dL (ref 30.0–36.0)
MCHC: 34.2 g/dL (ref 30.0–36.0)
MCHC: 32.8 g/dL (ref 30.0–36.0)
MCV: 92.2 fL (ref 80.0–100.0)
MCV: 91.5 fL (ref 80.0–100.0)
MCV: 94.8 fL (ref 80.0–100.0)
Platelets: 184 10*3/uL (ref 150–400)
Platelets: 131 10*3/uL — ABNORMAL LOW (ref 150–400)
Platelets: 151 10*3/uL (ref 150–400)
RBC: 3.98 MIL/uL (ref 3.87–5.11)
RBC: 3.9 MIL/uL (ref 3.87–5.11)
RBC: 3.86 MIL/uL — ABNORMAL LOW (ref 3.87–5.11)
RDW: 13.5 % (ref 11.5–15.5)
RDW: 13.7 % (ref 11.5–15.5)
RDW: 13.4 % (ref 11.5–15.5)
WBC: 12.5 10*3/uL — ABNORMAL HIGH (ref 4.0–10.5)
WBC: 11 10*3/uL — ABNORMAL HIGH (ref 4.0–10.5)
WBC: 10.9 10*3/uL — ABNORMAL HIGH (ref 4.0–10.5)
nRBC: 0 % (ref 0.0–0.2)
nRBC: 0 % (ref 0.0–0.2)
nRBC: 0 % (ref 0.0–0.2)

## 2019-04-07 LAB — PROTIME-INR
INR: 1.5 — ABNORMAL HIGH (ref 0.8–1.2)
Prothrombin Time: 17.5 seconds — ABNORMAL HIGH (ref 11.4–15.2)

## 2019-04-07 LAB — TRIGLYCERIDES: Triglycerides: 88 mg/dL (ref <150)

## 2019-04-07 LAB — PREPARE RBC (CROSSMATCH)

## 2019-04-07 LAB — MAGNESIUM: Magnesium: 1.7 mg/dL (ref 1.7–2.4)

## 2019-04-07 SURGERY — LAPAROTOMY, EXPLORATORY
Anesthesia: General | Site: Shoulder

## 2019-04-07 MED ORDER — CEFAZOLIN SODIUM-DEXTROSE 1-4 GM/50ML-% IV SOLN
1.0000 g | Freq: Three times a day (TID) | INTRAVENOUS | Status: AC
  Administered 2019-04-07 – 2019-04-08 (×3): 1 g via INTRAVENOUS
  Filled 2019-04-07 (×3): qty 50

## 2019-04-07 MED ORDER — ONDANSETRON HCL 4 MG/2ML IJ SOLN
INTRAMUSCULAR | Status: AC
  Filled 2019-04-07: qty 4

## 2019-04-07 MED ORDER — SUCCINYLCHOLINE CHLORIDE 200 MG/10ML IV SOSY
PREFILLED_SYRINGE | INTRAVENOUS | Status: AC
  Filled 2019-04-07: qty 10

## 2019-04-07 MED ORDER — FENTANYL CITRATE (PF) 250 MCG/5ML IJ SOLN
INTRAMUSCULAR | Status: AC
  Filled 2019-04-07: qty 10

## 2019-04-07 MED ORDER — PHENYLEPHRINE 40 MCG/ML (10ML) SYRINGE FOR IV PUSH (FOR BLOOD PRESSURE SUPPORT)
PREFILLED_SYRINGE | INTRAVENOUS | Status: AC
  Filled 2019-04-07: qty 10

## 2019-04-07 MED ORDER — CEFAZOLIN SODIUM-DEXTROSE 2-3 GM-%(50ML) IV SOLR
INTRAVENOUS | Status: DC | PRN
  Administered 2019-04-07: 2 g via INTRAVENOUS

## 2019-04-07 MED ORDER — MIDAZOLAM HCL 2 MG/2ML IJ SOLN
INTRAMUSCULAR | Status: AC
  Filled 2019-04-07: qty 2

## 2019-04-07 MED ORDER — DEXAMETHASONE SODIUM PHOSPHATE 10 MG/ML IJ SOLN
INTRAMUSCULAR | Status: AC
  Filled 2019-04-07: qty 1

## 2019-04-07 MED ORDER — ALBUMIN HUMAN 5 % IV SOLN
25.0000 g | Freq: Once | INTRAVENOUS | Status: AC
  Administered 2019-04-07: 25 g via INTRAVENOUS
  Filled 2019-04-07: qty 250

## 2019-04-07 MED ORDER — 0.9 % SODIUM CHLORIDE (POUR BTL) OPTIME
TOPICAL | Status: DC | PRN
  Administered 2019-04-07 (×2): 1000 mL
  Administered 2019-04-07: 15:00:00 3000 mL

## 2019-04-07 MED ORDER — SODIUM CHLORIDE 0.9% IV SOLUTION: Freq: Once | INTRAVENOUS | Status: DC

## 2019-04-07 MED ORDER — CEFAZOLIN SODIUM-DEXTROSE 2-4 GM/100ML-% IV SOLN
INTRAVENOUS | Status: AC
  Filled 2019-04-07: qty 100

## 2019-04-07 MED ORDER — FENTANYL CITRATE (PF) 250 MCG/5ML IJ SOLN
INTRAMUSCULAR | Status: DC | PRN
  Administered 2019-04-07: 150 ug via INTRAVENOUS
  Administered 2019-04-07: 250 ug via INTRAVENOUS
  Administered 2019-04-07: 100 ug via INTRAVENOUS

## 2019-04-07 MED ORDER — MIDAZOLAM HCL 5 MG/5ML IJ SOLN
INTRAMUSCULAR | Status: DC | PRN
  Administered 2019-04-07: 2 mg via INTRAVENOUS

## 2019-04-07 MED ORDER — ROCURONIUM BROMIDE 10 MG/ML (PF) SYRINGE
PREFILLED_SYRINGE | INTRAVENOUS | Status: AC
  Filled 2019-04-07: qty 40

## 2019-04-07 MED ORDER — ROCURONIUM BROMIDE 100 MG/10ML IV SOLN
INTRAVENOUS | Status: DC | PRN
  Administered 2019-04-07: 50 mg via INTRAVENOUS
  Administered 2019-04-07: 20 mg via INTRAVENOUS
  Administered 2019-04-07: 50 mg via INTRAVENOUS
  Administered 2019-04-07: 30 mg via INTRAVENOUS

## 2019-04-07 SURGICAL SUPPLY — 62 items
BAR GLASS FIBER EXFX 11X600 (EXFIX) ×2 IMPLANT
BIT DRILL SHORT ALPS 2.2 (BIT) ×1 IMPLANT
BIT DRILL SHORT ALPS 2.7 (BIT) ×1 IMPLANT
BLADE CLIPPER SURG (BLADE) IMPLANT
BNDG ELASTIC 4X5.8 VLCR STR LF (GAUZE/BANDAGES/DRESSINGS) ×1 IMPLANT
BNDG ELASTIC 6X5.8 VLCR STR LF (GAUZE/BANDAGES/DRESSINGS) ×1 IMPLANT
BNDG GAUZE ELAST 4 BULKY (GAUZE/BANDAGES/DRESSINGS) ×1 IMPLANT
CANISTER SUCT 3000ML PPV (MISCELLANEOUS) ×4 IMPLANT
CHLORAPREP W/TINT 26 (MISCELLANEOUS) ×4 IMPLANT
COVER SURGICAL LIGHT HANDLE (MISCELLANEOUS) ×4 IMPLANT
COVER WAND RF STERILE (DRAPES) ×4 IMPLANT
DRAPE C-ARM 42X72 X-RAY (DRAPES) ×1 IMPLANT
DRAPE C-ARMOR (DRAPES) ×1 IMPLANT
DRAPE LAPAROSCOPIC ABDOMINAL (DRAPES) ×4 IMPLANT
DRAPE ORTHO SPLIT 87X125 STRL (DRAPES) ×4 IMPLANT
DRAPE WARM FLUID 44X44 (DRAPES) ×4 IMPLANT
DRSG OPSITE POSTOP 4X10 (GAUZE/BANDAGES/DRESSINGS) IMPLANT
DRSG OPSITE POSTOP 4X12 (GAUZE/BANDAGES/DRESSINGS) ×1 IMPLANT
DRSG OPSITE POSTOP 4X8 (GAUZE/BANDAGES/DRESSINGS) IMPLANT
ELECT BLADE 6.5 EXT (BLADE) IMPLANT
ELECT CAUTERY BLADE 6.4 (BLADE) ×4 IMPLANT
ELECT REM PT RETURN 9FT ADLT (ELECTROSURGICAL) ×4
ELECTRODE REM PT RTRN 9FT ADLT (ELECTROSURGICAL) ×3 IMPLANT
GLOVE BIO SURGEON STRL SZ8 (GLOVE) ×4 IMPLANT
GLOVE BIOGEL PI IND STRL 8 (GLOVE) ×3 IMPLANT
GLOVE BIOGEL PI INDICATOR 8 (GLOVE) ×1
GOWN STRL REUS W/ TWL LRG LVL3 (GOWN DISPOSABLE) ×3 IMPLANT
GOWN STRL REUS W/ TWL XL LVL3 (GOWN DISPOSABLE) ×3 IMPLANT
GOWN STRL REUS W/TWL LRG LVL3 (GOWN DISPOSABLE) ×1
GOWN STRL REUS W/TWL XL LVL3 (GOWN DISPOSABLE) ×1
HALF PIN 5.0X160 (EXFIX) ×2 IMPLANT
HANDLE SUCTION POOLE (INSTRUMENTS) ×3 IMPLANT
KIT BASIN OR (CUSTOM PROCEDURE TRAY) ×4 IMPLANT
KIT TURNOVER KIT B (KITS) ×4 IMPLANT
LIGASURE IMPACT 36 18CM CVD LR (INSTRUMENTS) IMPLANT
NS IRRIG 1000ML POUR BTL (IV SOLUTION) ×8 IMPLANT
PACK GENERAL/GYN (CUSTOM PROCEDURE TRAY) ×4 IMPLANT
PAD ARMBOARD 7.5X6 YLW CONV (MISCELLANEOUS) ×4 IMPLANT
PAD CAST 4YDX4 CTTN HI CHSV (CAST SUPPLIES) IMPLANT
PADDING CAST COTTON 4X4 STRL (CAST SUPPLIES) ×1
PADDING CAST COTTON 6X4 STRL (CAST SUPPLIES) ×1 IMPLANT
PENCIL SMOKE EVACUATOR (MISCELLANEOUS) ×4 IMPLANT
PIN CLAMP 2BAR 75MM BLUE (EXFIX) ×2 IMPLANT
PIN HALF YELLOW 5X160X35 (EXFIX) ×2 IMPLANT
PLATE CLAV TIS DIP 90 8H LT (Plate) ×1 IMPLANT
SCREW 2.7X12MM (Screw) ×1 IMPLANT
SCREW CORT LP 3.5X12 (Screw) ×2 IMPLANT
SCREW CORT LP 3.5X14 (Screw) ×3 IMPLANT
SCREW LOCK CORT STAR 3.5X12 (Screw) ×3 IMPLANT
SPECIMEN JAR LARGE (MISCELLANEOUS) IMPLANT
SPONGE LAP 18X18 RF (DISPOSABLE) IMPLANT
STAPLER VISISTAT 35W (STAPLE) ×5 IMPLANT
SUCTION POOLE HANDLE (INSTRUMENTS) ×4
SUT PDS AB 1 TP1 96 (SUTURE) ×8 IMPLANT
SUT SILK 2 0 SH CR/8 (SUTURE) ×3 IMPLANT
SUT SILK 2 0 TIES 10X30 (SUTURE) ×3 IMPLANT
SUT SILK 3 0 SH CR/8 (SUTURE) ×3 IMPLANT
SUT SILK 3 0 TIES 10X30 (SUTURE) ×3 IMPLANT
TOWEL GREEN STERILE (TOWEL DISPOSABLE) ×4 IMPLANT
TRAY FOLEY MTR SLVR 16FR STAT (SET/KITS/TRAYS/PACK) IMPLANT
TUBE CONNECTING 12X1/4 (SUCTIONS) ×1 IMPLANT
YANKAUER SUCT BULB TIP NO VENT (SUCTIONS) IMPLANT

## 2019-04-07 NOTE — Anesthesia Postprocedure Evaluation (Signed)
Anesthesia Post Note  Patient: Regina Edwards  Procedure(s) Performed: EXPLORATORY LAPAROTOMY, REPAIR STOMACH LACERATION, AND LIVER PACKING (N/A Abdomen)     Patient location during evaluation: ICU Anesthesia Type: General Level of consciousness: sedated and patient remains intubated per anesthesia plan Pain management: pain level controlled Vital Signs Assessment: post-procedure vital signs reviewed and stable Respiratory status: patient remains intubated per anesthesia plan and patient on ventilator - see flowsheet for VS Cardiovascular status: stable Anesthetic complications: no    Last Vitals:  Vitals:   04/07/19 0800 04/07/19 0808  BP: (!) 89/53 112/62  Pulse: 87   Resp: 15   Temp: (!) 38.1 C   SpO2: 100%     Last Pain:  Vitals:   04/07/19 0800  TempSrc: Esophageal  PainSc:                  Nolon Nations
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
## 2019-04-07 NOTE — Progress Notes (Signed)
Patient ID: Regina Edwards, female   DOB: 1975/12/18, 43 y.o.   MRN: [id number] I met with her husband and updated him further.  Georganna Skeans, MD, MPH, FACS Trauma & General Surgery: [phone]
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
## 2019-04-07 NOTE — Progress Notes (Signed)
   04/07/19 1130  Clinical Encounter Type  Visited With Family  Visit Type Initial  Referral From Nurse  Consult/Referral To Chaplain  Spiritual Encounters  Spiritual Needs Emotional  Stress Factors  Patient Stress Factors None identified  Family Stress Factors Not reviewed   Chaplain visited after referral from the nurse during morning progression meeting.  The husband of the patient is showing a strong sense of spirituality for coping with the situation.  There was little need for the chaplain.  The chaplain provided information for the husband if they needed any spiritual care later.  Chaplain assess that no follow-up is needed.  Brion Aliment Chaplain Resident For questions concerning this note please contact me by pager [phone]
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
## 2019-04-07 NOTE — Transfer of Care (Signed)
Immediate Anesthesia Transfer of Care Note  Patient: Regina Edwards  Procedure(s) Performed: EXPLORATORY LAPAROTOMY (N/A Abdomen) Open Reduction Internal Fixation (Orif) Clavicular Fracture (Left Shoulder) application of external fixator left leg (Left Leg Upper)  Patient Location: ICU  Anesthesia Type:General  Level of Consciousness: Patient remains intubated per anesthesia plan  Airway & Oxygen Therapy: Patient remains intubated per anesthesia plan and Patient placed on Ventilator (see vital sign flow sheet for setting)  Post-op Assessment: Report given to RN and Post -op Vital signs reviewed and stable  Post vital signs: Reviewed and stable  Last Vitals:  Vitals Value Taken Time  BP 99/66 04/07/19 1630  Temp    Pulse 90 04/07/19 1641  Resp 15 04/07/19 1641  SpO2 100 % 04/07/19 1641  Vitals shown include unvalidated device data.  Last Pain:  Vitals:   04/07/19 0800  TempSrc: Esophageal  PainSc:          Complications: No apparent anesthesia complications

## 2019-04-07 NOTE — Anesthesia Postprocedure Evaluation (Signed)
Anesthesia Post Note  Patient: Regina Edwards  Procedure(s) Performed: EXPLORATORY LAPAROTOMY (N/A Abdomen) Open Reduction Internal Fixation (Orif) Clavicular Fracture (Left Shoulder) application of external fixator left leg (Left Leg Upper)     Patient location during evaluation: NICU Anesthesia Type: General Level of consciousness: sedated and patient remains intubated per anesthesia plan Pain management: pain level controlled Vital Signs Assessment: post-procedure vital signs reviewed and stable Respiratory status: patient remains intubated per anesthesia plan and patient on ventilator - see flowsheet for VS Cardiovascular status: stable Postop Assessment: no apparent nausea or vomiting Anesthetic complications: no    Last Vitals:  Vitals:   04/07/19 1934 04/07/19 1935  BP: (!) 162/81   Pulse: (!) 105   Resp: 15   Temp:    SpO2: 100% 100%    Last Pain:  Vitals:   04/07/19 0800  TempSrc: Esophageal  PainSc:                  Trey Gulbranson COKER

## 2019-04-08 ENCOUNTER — Inpatient Hospital Stay (HOSPITAL_COMMUNITY): Payer: BC Managed Care – PPO

## 2019-04-08 ENCOUNTER — Encounter (HOSPITAL_COMMUNITY): Payer: Self-pay | Admitting: General Surgery

## 2019-04-08 LAB — BASIC METABOLIC PANEL
Anion gap: 6 (ref 5–15)
BUN: 5 mg/dL — ABNORMAL LOW (ref 6–20)
CO2: 20 mmol/L — ABNORMAL LOW (ref 22–32)
Calcium: 7 mg/dL — ABNORMAL LOW (ref 8.9–10.3)
Chloride: 109 mmol/L (ref 98–111)
Creatinine, Ser: 0.55 mg/dL (ref 0.44–1.00)
GFR calc Af Amer: >60 mL/min (ref >60)
GFR calc non Af Amer: >60 mL/min (ref >60)
Glucose, Bld: 98 mg/dL (ref 70–99)
Potassium: 3.1 mmol/L — ABNORMAL LOW (ref 3.5–5.1)
Sodium: 135 mmol/L (ref 135–145)

## 2019-04-08 LAB — TRIGLYCERIDES: Triglycerides: 159 mg/dL — ABNORMAL HIGH (ref <150)

## 2019-04-08 MED ORDER — ACETAMINOPHEN 10 MG/ML IV SOLN
1000.0000 mg | Freq: Four times a day (QID) | INTRAVENOUS | Status: AC
  Administered 2019-04-08 (×2): 1000 mg via INTRAVENOUS
  Filled 2019-04-08 (×2): qty 100

## 2019-04-08 MED ORDER — LORAZEPAM 2 MG/ML IJ SOLN
1.0000 mg | INTRAMUSCULAR | Status: DC | PRN
  Administered 2019-04-08 – 2019-04-09 (×2): 1 mg via INTRAVENOUS
  Filled 2019-04-08 (×2): qty 1

## 2019-04-08 MED ORDER — PROPOFOL 1000 MG/100ML IV EMUL
0.0000 ug/kg/min | INTRAVENOUS | Status: DC
  Administered 2019-04-08 (×2): 40 ug/kg/min via INTRAVENOUS
  Administered 2019-04-09: 35 ug/kg/min via INTRAVENOUS
  Administered 2019-04-09 (×2): 40 ug/kg/min via INTRAVENOUS
  Administered 2019-04-10 (×2): 45 ug/kg/min via INTRAVENOUS
  Administered 2019-04-10: 35 ug/kg/min via INTRAVENOUS
  Administered 2019-04-11 (×3): 40 ug/kg/min via INTRAVENOUS
  Administered 2019-04-12 – 2019-04-13 (×3): 20 ug/kg/min via INTRAVENOUS
  Administered 2019-04-14: 20.009 ug/kg/min via INTRAVENOUS
  Administered 2019-04-14: 30 ug/kg/min via INTRAVENOUS
  Administered 2019-04-14: 29.991 ug/kg/min via INTRAVENOUS
  Administered 2019-04-14: 20 ug/kg/min via INTRAVENOUS
  Administered 2019-04-15: 29.991 ug/kg/min via INTRAVENOUS
  Administered 2019-04-15: 25 ug/kg/min via INTRAVENOUS
  Administered 2019-04-15: 30 ug/kg/min via INTRAVENOUS
  Filled 2019-04-08 (×25): qty 100

## 2019-04-08 MED ORDER — FENTANYL BOLUS VIA INFUSION
50.0000 ug | INTRAVENOUS | Status: DC | PRN
  Administered 2019-04-11 – 2019-04-15 (×32): 50 ug via INTRAVENOUS
  Filled 2019-04-08: qty 50

## 2019-04-08 MED ORDER — HYDROMORPHONE HCL 1 MG/ML IJ SOLN
1.0000 mg | INTRAMUSCULAR | Status: DC | PRN
  Administered 2019-04-08: 1 mg via INTRAVENOUS

## 2019-04-08 MED ORDER — POTASSIUM CHLORIDE 10 MEQ/100ML IV SOLN
10.0000 meq | INTRAVENOUS | Status: AC
  Administered 2019-04-08 (×4): 10 meq via INTRAVENOUS
  Filled 2019-04-08 (×4): qty 100

## 2019-04-08 MED ORDER — FENTANYL 2500MCG IN NS 250ML (10MCG/ML) PREMIX INFUSION
50.0000 ug/h | INTRAVENOUS | Status: DC
  Administered 2019-04-08: 11:00:00 100 ug/h via INTRAVENOUS
  Administered 2019-04-09: 04:00:00 150 ug/h via INTRAVENOUS
  Administered 2019-04-09: 175 ug/h via INTRAVENOUS
  Administered 2019-04-10: 07:00:00 150 ug/h via INTRAVENOUS
  Administered 2019-04-10 – 2019-04-11 (×3): 200 ug/h via INTRAVENOUS
  Administered 2019-04-12: 125 ug/h via INTRAVENOUS
  Administered 2019-04-12: 18:00:00 175 ug/h via INTRAVENOUS
  Administered 2019-04-13: 200 ug/h via INTRAVENOUS
  Administered 2019-04-13: 175 ug/h via INTRAVENOUS
  Administered 2019-04-14: 21:00:00 100 ug/h via INTRAVENOUS
  Administered 2019-04-14 – 2019-04-15 (×2): 50 ug/h via INTRAVENOUS
  Filled 2019-04-08 (×13): qty 250

## 2019-04-08 MED ORDER — HYDROMORPHONE HCL 1 MG/ML IJ SOLN: 0.5000 mg | INTRAMUSCULAR | Status: DC | PRN

## 2019-04-08 MED ORDER — FENTANYL CITRATE (PF) 100 MCG/2ML IJ SOLN: 50.0000 ug | Freq: Once | INTRAMUSCULAR | Status: DC

## 2019-04-08 MED ORDER — HYDROMORPHONE HCL 1 MG/ML IJ SOLN
INTRAMUSCULAR | Status: AC
  Filled 2019-04-08: qty 1
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
## 2019-04-08 NOTE — Progress Notes (Signed)
MRI aware of orders to come down for scan.  MRI techs checking to see if Ex Fix is compatible with MRI.  RN awaiting for call back.

## 2019-04-08 NOTE — Progress Notes (Signed)
RT note: per RN, holding on extubation at this time due to returning to OR later this week.  Patient was weaning on CPAP/PSV of 5/5 however placed patient back on full support settings to allow patient to rest.  Tolerating well at this time.  Will continue to monitor.
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
## 2019-04-09 ENCOUNTER — Inpatient Hospital Stay (HOSPITAL_COMMUNITY): Payer: BC Managed Care – PPO

## 2019-04-09 ENCOUNTER — Encounter (HOSPITAL_COMMUNITY): Payer: Self-pay | Admitting: *Deleted

## 2019-04-09 LAB — BASIC METABOLIC PANEL
Anion gap: 9 (ref 5–15)
BUN: <5 mg/dL — ABNORMAL LOW (ref 6–20)
CO2: 20 mmol/L — ABNORMAL LOW (ref 22–32)
Calcium: 7.3 mg/dL — ABNORMAL LOW (ref 8.9–10.3)
Chloride: 107 mmol/L (ref 98–111)
Creatinine, Ser: 0.6 mg/dL (ref 0.44–1.00)
GFR calc Af Amer: >60 mL/min (ref >60)
GFR calc non Af Amer: >60 mL/min (ref >60)
Glucose, Bld: 86 mg/dL (ref 70–99)
Potassium: 3.1 mmol/L — ABNORMAL LOW (ref 3.5–5.1)
Sodium: 136 mmol/L (ref 135–145)

## 2019-04-09 LAB — MAGNESIUM
Magnesium: 1.9 mg/dL (ref 1.7–2.4)
Magnesium: 1.9 mg/dL (ref 1.7–2.4)

## 2019-04-09 LAB — PREPARE RBC (CROSSMATCH)

## 2019-04-09 LAB — GLUCOSE, CAPILLARY
Glucose-Capillary: 108 mg/dL — ABNORMAL HIGH (ref 70–99)
Glucose-Capillary: 83 mg/dL (ref 70–99)
Glucose-Capillary: 92 mg/dL (ref 70–99)

## 2019-04-09 LAB — TRIGLYCERIDES: Triglycerides: 144 mg/dL (ref <150)

## 2019-04-09 LAB — CBC
HCT: 31.1 % — ABNORMAL LOW (ref 36.0–46.0)
Hemoglobin: 10.4 g/dL — ABNORMAL LOW (ref 12.0–15.0)
MCH: 30.9 pg (ref 26.0–34.0)
MCHC: 33.4 g/dL (ref 30.0–36.0)
MCV: 92.3 fL (ref 80.0–100.0)
Platelets: 156 10*3/uL (ref 150–400)
RBC: 3.37 MIL/uL — ABNORMAL LOW (ref 3.87–5.11)
RDW: 13.2 % (ref 11.5–15.5)
WBC: 8 10*3/uL (ref 4.0–10.5)
nRBC: 0 % (ref 0.0–0.2)

## 2019-04-09 LAB — PHOSPHORUS
Phosphorus: 1.1 mg/dL — ABNORMAL LOW (ref 2.5–4.6)
Phosphorus: 1.3 mg/dL — ABNORMAL LOW (ref 2.5–4.6)

## 2019-04-09 MED ORDER — PRO-STAT SUGAR FREE PO LIQD
30.0000 mL | Freq: Two times a day (BID) | ORAL | Status: DC
  Administered 2019-04-09: 10:00:00 30 mL
  Filled 2019-04-09: qty 30

## 2019-04-09 MED ORDER — PANTOPRAZOLE SODIUM 40 MG PO PACK
40.0000 mg | PACK | Freq: Every day | ORAL | Status: DC
  Administered 2019-04-09 – 2019-04-20 (×12): 40 mg
  Filled 2019-04-09 (×12): qty 20

## 2019-04-09 MED ORDER — SELENIUM 200 MCG PO TABS
200.0000 ug | ORAL_TABLET | Freq: Every day | ORAL | Status: AC
  Administered 2019-04-09 – 2019-04-15 (×7): 200 ug
  Filled 2019-04-09 (×7): qty 1

## 2019-04-09 MED ORDER — PRO-STAT SUGAR FREE PO LIQD
60.0000 mL | Freq: Two times a day (BID) | ORAL | Status: DC
  Administered 2019-04-09 – 2019-04-15 (×12): 60 mL
  Filled 2019-04-09 (×12): qty 60

## 2019-04-09 MED ORDER — PIVOT 1.5 CAL PO LIQD
1000.0000 mL | ORAL | Status: DC
  Administered 2019-04-09 – 2019-04-13 (×3): 1000 mL
  Filled 2019-04-09: qty 1000

## 2019-04-09 MED ORDER — SODIUM CHLORIDE 0.9% IV SOLUTION: Freq: Once | INTRAVENOUS | Status: DC

## 2019-04-09 MED ORDER — VITAMIN C 500 MG PO TABS
1000.0000 mg | ORAL_TABLET | Freq: Three times a day (TID) | ORAL | Status: AC
  Administered 2019-04-09 – 2019-04-15 (×20): 1000 mg
  Filled 2019-04-09 (×20): qty 2

## 2019-04-09 MED ORDER — POTASSIUM CHLORIDE 20 MEQ/15ML (10%) PO SOLN
40.0000 meq | Freq: Two times a day (BID) | ORAL | Status: AC
  Administered 2019-04-09 (×2): 40 meq
  Filled 2019-04-09 (×2): qty 30

## 2019-04-09 MED ORDER — VITAL HIGH PROTEIN PO LIQD
1000.0000 mL | ORAL | Status: AC
  Administered 2019-04-09: 1000 mL
  Administered 2019-04-09: 18:00:00

## 2019-04-09 MED ORDER — ACETAMINOPHEN 160 MG/5ML PO SOLN
650.0000 mg | Freq: Four times a day (QID) | ORAL | Status: DC | PRN
  Administered 2019-04-09 – 2019-04-15 (×12): 650 mg
  Filled 2019-04-09 (×11): qty 20.3

## 2019-04-09 MED ORDER — CALCIUM GLUCONATE-NACL 1-0.675 GM/50ML-% IV SOLN
1.0000 g | Freq: Once | INTRAVENOUS | Status: AC
  Administered 2019-04-09: 1000 mg via INTRAVENOUS
  Filled 2019-04-09: qty 50

## 2019-04-09 MED ORDER — CEFAZOLIN SODIUM-DEXTROSE 2-4 GM/100ML-% IV SOLN
2.0000 g | Freq: Three times a day (TID) | INTRAVENOUS | Status: DC
  Administered 2019-04-10: 2 g via INTRAVENOUS
  Filled 2019-04-09 (×2): qty 100
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
## 2019-04-10 ENCOUNTER — Encounter (HOSPITAL_COMMUNITY): Admission: EM | Disposition: A | Discharge status: Home Health | Payer: Self-pay | Source: Home / Self Care

## 2019-04-10 ENCOUNTER — Inpatient Hospital Stay (HOSPITAL_COMMUNITY): Payer: BC Managed Care – PPO

## 2019-04-10 ENCOUNTER — Inpatient Hospital Stay (HOSPITAL_COMMUNITY): Payer: BC Managed Care – PPO | Admitting: Anesthesiology

## 2019-04-10 HISTORY — PX: APPLICATION OF WOUND VAC: SHX5189

## 2019-04-10 HISTORY — PX: ORIF TIBIA PLATEAU: SHX2132

## 2019-04-10 HISTORY — PX: EXTERNAL FIXATION REMOVAL: SHX5040

## 2019-04-10 LAB — CBC
HCT: 28.7 % — ABNORMAL LOW (ref 36.0–46.0)
Hemoglobin: 9.5 g/dL — ABNORMAL LOW (ref 12.0–15.0)
MCH: 31.1 pg (ref 26.0–34.0)
MCHC: 33.1 g/dL (ref 30.0–36.0)
MCV: 94.1 fL (ref 80.0–100.0)
Platelets: 201 10*3/uL (ref 150–400)
RBC: 3.05 MIL/uL — ABNORMAL LOW (ref 3.87–5.11)
RDW: 13.3 % (ref 11.5–15.5)
WBC: 7.5 10*3/uL (ref 4.0–10.5)
nRBC: 0 % (ref 0.0–0.2)

## 2019-04-10 LAB — TYPE AND SCREEN
ABO/RH(D): O POS
Antibody Screen: NEGATIVE
Unit division: 0
Unit division: 0
Unit division: 0
Unit division: 0
Unit division: 0
Unit division: 0
Unit division: 0
Unit division: 0
Unit division: 0
Unit division: 0
Unit division: 0
Unit division: 0
Unit division: 0
Unit division: 0

## 2019-04-10 LAB — BPAM RBC
Blood Product Expiration Date: 202009102359
Blood Product Expiration Date: 202009112359
Blood Product Expiration Date: 202010032359
Blood Product Expiration Date: 202010032359
Blood Product Expiration Date: 202010032359
Blood Product Expiration Date: 202010032359
Blood Product Expiration Date: 202010032359
Blood Product Expiration Date: 202010032359
Blood Product Expiration Date: 202010042359
Blood Product Expiration Date: 202010042359
Blood Product Expiration Date: 202010042359
Blood Product Expiration Date: 202010042359
Blood Product Expiration Date: 202010042359
Blood Product Expiration Date: 202010042359
ISSUE DATE / TIME: 202008300125
ISSUE DATE / TIME: 202008300313
ISSUE DATE / TIME: 202008300313
ISSUE DATE / TIME: 202008300313
ISSUE DATE / TIME: 202008300313
ISSUE DATE / TIME: 202008301015
ISSUE DATE / TIME: 202008301135
ISSUE DATE / TIME: 202008301209
ISSUE DATE / TIME: 202008301317
ISSUE DATE / TIME: 202008301731
ISSUE DATE / TIME: 202008310133
ISSUE DATE / TIME: 202008312237
Unit Type and Rh: 5100
Unit Type and Rh: 5100
Unit Type and Rh: 5100
Unit Type and Rh: 5100
Unit Type and Rh: 5100
Unit Type and Rh: 5100
Unit Type and Rh: 5100
Unit Type and Rh: 5100
Unit Type and Rh: 5100
Unit Type and Rh: 5100
Unit Type and Rh: 5100
Unit Type and Rh: 5100
Unit Type and Rh: 9500
Unit Type and Rh: 9500

## 2019-04-10 LAB — VITAMIN D 25 HYDROXY (VIT D DEFICIENCY, FRACTURES): Vit D, 25-Hydroxy: 19.8 ng/mL — ABNORMAL LOW (ref 30.0–100.0)

## 2019-04-10 LAB — BASIC METABOLIC PANEL
Anion gap: 9 (ref 5–15)
BUN: 6 mg/dL (ref 6–20)
CO2: 24 mmol/L (ref 22–32)
Calcium: 7 mg/dL — ABNORMAL LOW (ref 8.9–10.3)
Chloride: 104 mmol/L (ref 98–111)
Creatinine, Ser: 0.53 mg/dL (ref 0.44–1.00)
GFR calc Af Amer: >60 mL/min (ref >60)
GFR calc non Af Amer: >60 mL/min (ref >60)
Glucose, Bld: 109 mg/dL — ABNORMAL HIGH (ref 70–99)
Potassium: 3.4 mmol/L — ABNORMAL LOW (ref 3.5–5.1)
Sodium: 137 mmol/L (ref 135–145)

## 2019-04-10 LAB — GLUCOSE, CAPILLARY
Glucose-Capillary: 100 mg/dL — ABNORMAL HIGH (ref 70–99)
Glucose-Capillary: 101 mg/dL — ABNORMAL HIGH (ref 70–99)
Glucose-Capillary: 107 mg/dL — ABNORMAL HIGH (ref 70–99)
Glucose-Capillary: 107 mg/dL — ABNORMAL HIGH (ref 70–99)
Glucose-Capillary: 79 mg/dL (ref 70–99)

## 2019-04-10 LAB — PHOSPHORUS: Phosphorus: 1.5 mg/dL — ABNORMAL LOW (ref 2.5–4.6)

## 2019-04-10 LAB — PROTIME-INR
INR: 1.1 (ref 0.8–1.2)
Prothrombin Time: 14.3 seconds (ref 11.4–15.2)

## 2019-04-10 LAB — MAGNESIUM: Magnesium: 1.9 mg/dL (ref 1.7–2.4)

## 2019-04-10 SURGERY — OPEN REDUCTION INTERNAL FIXATION (ORIF) TIBIAL PLATEAU
Anesthesia: General | Site: Leg Lower | Laterality: Left

## 2019-04-10 MED ORDER — EPHEDRINE 5 MG/ML INJ
INTRAVENOUS | Status: AC
  Filled 2019-04-10: qty 10

## 2019-04-10 MED ORDER — CEFAZOLIN SODIUM-DEXTROSE 2-4 GM/100ML-% IV SOLN
INTRAVENOUS | Status: AC
  Filled 2019-04-10: qty 100

## 2019-04-10 MED ORDER — FENTANYL CITRATE (PF) 250 MCG/5ML IJ SOLN
INTRAMUSCULAR | Status: AC
  Filled 2019-04-10: qty 5

## 2019-04-10 MED ORDER — 0.9 % SODIUM CHLORIDE (POUR BTL) OPTIME
TOPICAL | Status: DC | PRN
  Administered 2019-04-10: 1000 mL

## 2019-04-10 MED ORDER — FENTANYL CITRATE (PF) 250 MCG/5ML IJ SOLN
INTRAMUSCULAR | Status: DC | PRN
  Administered 2019-04-10 (×5): 50 ug via INTRAVENOUS

## 2019-04-10 MED ORDER — ROCURONIUM BROMIDE 10 MG/ML (PF) SYRINGE
PREFILLED_SYRINGE | INTRAVENOUS | Status: DC | PRN
  Administered 2019-04-10 (×2): 100 mg via INTRAVENOUS

## 2019-04-10 MED ORDER — CEFAZOLIN SODIUM-DEXTROSE 2-4 GM/100ML-% IV SOLN
2.0000 g | Freq: Three times a day (TID) | INTRAVENOUS | Status: AC
  Administered 2019-04-10 – 2019-04-11 (×3): 2 g via INTRAVENOUS
  Filled 2019-04-10 (×3): qty 100

## 2019-04-10 MED ORDER — PROPOFOL 1000 MG/100ML IV EMUL
INTRAVENOUS | Status: AC
  Filled 2019-04-10: qty 100

## 2019-04-10 MED ORDER — CEFAZOLIN SODIUM-DEXTROSE 1-4 GM/50ML-% IV SOLN: 1.0000 g | Freq: Three times a day (TID) | INTRAVENOUS | Status: DC

## 2019-04-10 MED ORDER — PROPOFOL 10 MG/ML IV BOLUS
INTRAVENOUS | Status: AC
  Filled 2019-04-10: qty 20

## 2019-04-10 MED ORDER — MIDAZOLAM HCL 2 MG/2ML IJ SOLN
INTRAMUSCULAR | Status: AC
  Filled 2019-04-10: qty 2

## 2019-04-10 MED ORDER — POTASSIUM CHLORIDE 10 MEQ/100ML IV SOLN
10.0000 meq | INTRAVENOUS | Status: AC
  Administered 2019-04-10 (×2): 10 meq via INTRAVENOUS
  Filled 2019-04-10 (×2): qty 100

## 2019-04-10 MED ORDER — PHENYLEPHRINE 40 MCG/ML (10ML) SYRINGE FOR IV PUSH (FOR BLOOD PRESSURE SUPPORT)
PREFILLED_SYRINGE | INTRAVENOUS | Status: AC
  Filled 2019-04-10: qty 10

## 2019-04-10 MED ORDER — MIDAZOLAM HCL 2 MG/2ML IJ SOLN
INTRAMUSCULAR | Status: DC | PRN
  Administered 2019-04-10: 2 mg via INTRAVENOUS

## 2019-04-10 MED ORDER — ROCURONIUM BROMIDE 10 MG/ML (PF) SYRINGE
PREFILLED_SYRINGE | INTRAVENOUS | Status: AC
  Filled 2019-04-10: qty 10

## 2019-04-10 MED ORDER — POTASSIUM PHOSPHATES 15 MMOLE/5ML IV SOLN
20.0000 mmol | Freq: Once | INTRAVENOUS | Status: AC
  Administered 2019-04-10 (×2): 20 mmol via INTRAVENOUS
  Filled 2019-04-10: qty 6.67

## 2019-04-10 SURGICAL SUPPLY — 96 items
BANDAGE ESMARK 6X9 LF (GAUZE/BANDAGES/DRESSINGS) IMPLANT
BIT DRILL 100X2.5XANTM LCK (BIT) ×1 IMPLANT
BIT DRILL CAL (BIT) ×1 IMPLANT
BIT DRL 100X2.5XANTM LCK (BIT) ×1
BLADE CLIPPER SURG (BLADE) IMPLANT
BLADE SURG 10 STRL SS (BLADE) ×2 IMPLANT
BLADE SURG 15 STRL LF DISP TIS (BLADE) IMPLANT
BLADE SURG 15 STRL SS (BLADE)
BNDG COHESIVE 4X5 TAN STRL (GAUZE/BANDAGES/DRESSINGS) IMPLANT
BNDG ELASTIC 4X5.8 VLCR STR LF (GAUZE/BANDAGES/DRESSINGS) ×2 IMPLANT
BNDG ELASTIC 6X10 VLCR STRL LF (GAUZE/BANDAGES/DRESSINGS) ×2 IMPLANT
BNDG ELASTIC 6X5.8 VLCR STR LF (GAUZE/BANDAGES/DRESSINGS) ×2 IMPLANT
BNDG ESMARK 6X9 LF (GAUZE/BANDAGES/DRESSINGS)
BNDG GAUZE ELAST 4 BULKY (GAUZE/BANDAGES/DRESSINGS) ×4 IMPLANT
BRUSH SCRUB EZ PLAIN DRY (MISCELLANEOUS) ×4 IMPLANT
CANISTER SUCT 3000ML PPV (MISCELLANEOUS) ×2 IMPLANT
CANISTER WOUND CARE 500ML ATS (WOUND CARE) ×2 IMPLANT
COVER MAYO STAND STRL (DRAPES) ×2 IMPLANT
COVER SURGICAL LIGHT HANDLE (MISCELLANEOUS) ×4 IMPLANT
COVER WAND RF STERILE (DRAPES) ×2 IMPLANT
CUFF TOURN SGL QUICK 34 (TOURNIQUET CUFF) ×1
CUFF TRNQT CYL 34X4.125X (TOURNIQUET CUFF) ×1 IMPLANT
DRAPE C-ARM 42X72 X-RAY (DRAPES) ×2 IMPLANT
DRAPE C-ARMOR (DRAPES) ×2 IMPLANT
DRAPE HALF SHEET 40X57 (DRAPES) IMPLANT
DRAPE INCISE IOBAN 66X45 STRL (DRAPES) ×2 IMPLANT
DRAPE U-SHAPE 47X51 STRL (DRAPES) ×2 IMPLANT
DRESSING PEEL AND PLAC PRVNA20 (GAUZE/BANDAGES/DRESSINGS) ×1 IMPLANT
DRILL BIT 2.5MM (BIT) ×1
DRILL BIT CAL (BIT) ×2
DRSG ADAPTIC 3X8 NADH LF (GAUZE/BANDAGES/DRESSINGS) ×2 IMPLANT
DRSG MEPILEX BORDER 4X8 (GAUZE/BANDAGES/DRESSINGS) ×2 IMPLANT
DRSG PAD ABDOMINAL 8X10 ST (GAUZE/BANDAGES/DRESSINGS) ×4 IMPLANT
DRSG PEEL AND PLACE PREVENA 20 (GAUZE/BANDAGES/DRESSINGS) ×2
ELECT REM PT RETURN 9FT ADLT (ELECTROSURGICAL) ×2
ELECTRODE REM PT RTRN 9FT ADLT (ELECTROSURGICAL) ×1 IMPLANT
GAUZE SPONGE 4X4 12PLY STRL (GAUZE/BANDAGES/DRESSINGS) ×2 IMPLANT
GLOVE BIO SURGEON STRL SZ7.5 (GLOVE) ×4 IMPLANT
GLOVE BIO SURGEON STRL SZ8 (GLOVE) ×6 IMPLANT
GLOVE BIOGEL PI IND STRL 6.5 (GLOVE) ×1 IMPLANT
GLOVE BIOGEL PI IND STRL 7.5 (GLOVE) ×1 IMPLANT
GLOVE BIOGEL PI IND STRL 8 (GLOVE) ×2 IMPLANT
GLOVE BIOGEL PI IND STRL 8.5 (GLOVE) ×2 IMPLANT
GLOVE BIOGEL PI INDICATOR 6.5 (GLOVE) ×1
GLOVE BIOGEL PI INDICATOR 7.5 (GLOVE) ×1
GLOVE BIOGEL PI INDICATOR 8 (GLOVE) ×2
GLOVE BIOGEL PI INDICATOR 8.5 (GLOVE) ×2
GOWN STRL REUS W/ TWL LRG LVL3 (GOWN DISPOSABLE) ×2 IMPLANT
GOWN STRL REUS W/ TWL XL LVL3 (GOWN DISPOSABLE) ×1 IMPLANT
GOWN STRL REUS W/TWL LRG LVL3 (GOWN DISPOSABLE) ×2
GOWN STRL REUS W/TWL XL LVL3 (GOWN DISPOSABLE) ×1
IMMOBILIZER KNEE 22 UNIV (SOFTGOODS) IMPLANT
K-WIRE ACE 1.6X6 (WIRE) ×4
KIT BASIN OR (CUSTOM PROCEDURE TRAY) ×2 IMPLANT
KIT TURNOVER KIT B (KITS) ×2 IMPLANT
KWIRE ACE 1.6X6 (WIRE) ×2 IMPLANT
MANIFOLD NEPTUNE II (INSTRUMENTS) IMPLANT
NDL SUT 6 .5 CRC .975X.05 MAYO (NEEDLE) IMPLANT
NEEDLE MAYO TAPER (NEEDLE)
NS IRRIG 1000ML POUR BTL (IV SOLUTION) ×2 IMPLANT
PACK ORTHO EXTREMITY (CUSTOM PROCEDURE TRAY) ×2 IMPLANT
PAD ARMBOARD 7.5X6 YLW CONV (MISCELLANEOUS) ×4 IMPLANT
PAD CAST 4YDX4 CTTN HI CHSV (CAST SUPPLIES) ×1 IMPLANT
PADDING CAST COTTON 4X4 STRL (CAST SUPPLIES) ×1
PADDING CAST COTTON 6X4 STRL (CAST SUPPLIES) ×2 IMPLANT
PLATE LOCK 5H STD LT PROX TIB (Plate) ×2 IMPLANT
SCREW CORTICAL 3.5MM  30MM (Screw) ×2 IMPLANT
SCREW CORTICAL 3.5MM  34MM (Screw) ×1 IMPLANT
SCREW CORTICAL 3.5MM 30MM (Screw) ×2 IMPLANT
SCREW CORTICAL 3.5MM 34MM (Screw) ×1 IMPLANT
SCREW CORTICAL 3.5MM 38MM (Screw) ×2 IMPLANT
SCREW LOCK CORT STAR 3.5X60 (Screw) ×2 IMPLANT
SCREW LOCK CORT STAR 3.5X70 (Screw) ×6 IMPLANT
SCREW LP 3.5X65MM (Screw) ×2 IMPLANT
SCREW LP 3.5X70MM (Screw) ×2 IMPLANT
SPONGE LAP 18X18 RF (DISPOSABLE) ×2 IMPLANT
STAPLER VISISTAT 35W (STAPLE) ×2 IMPLANT
STOCKINETTE IMPERVIOUS LG (DRAPES) IMPLANT
SUCTION FRAZIER HANDLE 10FR (MISCELLANEOUS) ×1
SUCTION TUBE FRAZIER 10FR DISP (MISCELLANEOUS) ×1 IMPLANT
SUT ETHILON 2 0 PSLX (SUTURE) ×4 IMPLANT
SUT ETHILON 3 0 PS 1 (SUTURE) ×2 IMPLANT
SUT PROLENE 0 CT 2 (SUTURE) ×4 IMPLANT
SUT VIC AB 0 CT1 27 (SUTURE) ×1
SUT VIC AB 0 CT1 27XBRD ANBCTR (SUTURE) ×1 IMPLANT
SUT VIC AB 1 CT1 27 (SUTURE) ×2
SUT VIC AB 1 CT1 27XBRD ANBCTR (SUTURE) ×2 IMPLANT
SUT VIC AB 2-0 CT1 27 (SUTURE) ×1
SUT VIC AB 2-0 CT1 TAPERPNT 27 (SUTURE) ×1 IMPLANT
TOWEL GREEN STERILE (TOWEL DISPOSABLE) ×4 IMPLANT
TOWEL GREEN STERILE FF (TOWEL DISPOSABLE) ×4 IMPLANT
TRAY FOLEY MTR SLVR 16FR STAT (SET/KITS/TRAYS/PACK) IMPLANT
TUBE CONNECTING 12X1/4 (SUCTIONS) ×4 IMPLANT
UNDERPAD 30X30 (UNDERPADS AND DIAPERS) ×2 IMPLANT
WATER STERILE IRR 1000ML POUR (IV SOLUTION) ×2 IMPLANT
YANKAUER SUCT BULB TIP NO VENT (SUCTIONS) ×2 IMPLANT

## 2019-04-10 NOTE — Transfer of Care (Signed)
Immediate Anesthesia Transfer of Care Note  Patient: Regina Edwards  Procedure(s) Performed: OPEN REDUCTION INTERNAL FIXATION (ORIF) TIBIAL PLATEAU (Left Leg Lower) REMOVAL EXTERNAL FIXATION LEG (Left Leg Lower)  Patient Location: ICU  Anesthesia Type:General  Level of Consciousness: sedated, patient cooperative and Patient remains intubated per anesthesia plan  Airway & Oxygen Therapy: Patient remains intubated per anesthesia plan and Patient placed on Ventilator (see vital sign flow sheet for setting)  Post-op Assessment: Report given to RN and Post -op Vital signs reviewed and stable  Post vital signs: Reviewed and stable  Last Vitals:  Vitals Value Taken Time  BP    Temp 36.1 C 04/10/19 1423  Pulse 89 04/10/19 1423  Resp 15 04/10/19 1423  SpO2 97 % 04/10/19 1423  Vitals shown include unvalidated device data.  Last Pain:  Vitals:   04/10/19 0800  TempSrc: Bladder  PainSc:          Complications: No apparent anesthesia complications

## 2019-04-10 NOTE — Op Note (Signed)
04/10/2019  2:21 PM  PATIENT:  Regina Edwards  43 y.o. female  PRE-OPERATIVE DIAGNOSIS:   1. LEFT BICONDYLAR TIBIAL PLATEAU FRACTURE 2. LEFT TIBIAL EMINENCE FRACTURE 3. RETAINED EXTERNAL FIXATOR 4. POSTEROLATERAL CORNER LIGAMENTOUS INJURY  POST-OPERATIVE DIAGNOSIS:   1. LEFT BICONDYLAR TIBIAL PLATEAU FRACTURE 2. LEFT TIBIAL EMINENCE FRACTURE 3. RETAINED EXTERNAL FIXATOR 4. PARTIAL POSTEROLATERAL CORNER LIGAMENTOUS INJURY 5. INTACT LATERAL MENISCUS  PROCEDURE:  Procedure(s): 1. OPEN REDUCTION INTERNAL FIXATION (ORIF) BICONDYLAR TIBIAL PLATEAU (Left) 2. OPEN TREATMENT OF TIBIAL EMINENCE FRACTURE 3. REMOVAL EXTERNAL FIXATION LEG (Left) 4. ANTERIOR COMPARTMENT FASCIOTOMY 5. EXAMINATION OF LEFT KNEE WITH STRESS FLUOROSCOPY  SURGEON:  Surgeon(s) and Role:    Altamese Petersburg, MD - Primary  PHYSICIAN ASSISTANT: Ainsley Spinner, PA-C  ANESTHESIA:   general  I/O:  Total I/O In: 1207.7 [I.V.:823.4; IV Piggyback:384.4] Out: 375 [Urine:375]  SPECIMEN:  No Specimen  TOURNIQUET:  * No tourniquets in log *  DICTATION: .Note written in EPIC   DISPOSITION: ICU  CONDITION: INTUBATED, HEMODYNAMICALLY STABLE     BRIEF SUMMARY AND INDICATION FOR PROCEDURE:  Patient is a 43 y.o.-year- old with a tibial plateau fracture, treated provisionally with external fixation, elevation, and compression to facilitate resolution of soft tissue swelling.  Her husband and I discussed the risks and benefits of surgical treatment including the potential for arthritis, nerve injury, vessel injury, loss of motion, DVT, PE, heart attack, stroke, symptomatic hardware, need for further surgery, and multiple others.  Consent was provided to proceed.   BRIEF SUMMARY OF PROCEDURE:  After administration of preoperative antibiotics, the patient was taken to the operating room.  General anesthesia was induced and then scrubbing of the skin and pin sites with chlorhexidine sponge performed. The lower extremity prepped  and draped in usual sterile fashion using a chlorhexidine wash and betadine scrub and paint. A timeout was performed.  We did retain the fixator to protect the neurovascular structures during prepping. Once time-out was held, the leg was isolated from the clamps with towels and the fixator removed. I then brought in the radiolucent triangle.  A straight lateral parapatellar incision was made extending laterally over Gerdy's tubercle. Dissection was carried down where the soft tissues were left intact to the lateral plateau and rim.  I did incise the retinaculum proximal to the tibial plateau, and then a submeniscal arthrotomy, releasing the coronary ligament along its insertion onto the tibia. Zero prolene suture was used to reflect this and inspect the meniscus and joint surface. The joint was irrigated thoroughly and this revealed a securely attached, intact, and stable lateral meniscus. The joint surface was in excellent condition with a displaced fracture line extending into the lateral plateau. I was able to mobilize the fracture site and then perform a reduction maneauver with the assistance of my PA resulting in anatomic reduction through the metaphysis. Once reduced, the articular surface fracture was actually just medial to the weight bearing surface of the lateral plateau. The tibial eminence was mobilized at the same time as the primary fracture line and secured between the two segments. There was no further motion of the anterior aspect of the eminence after this reduction was secured with the Erma Pinto clamp that applied compressive force across the joint line. At this point, we placed standard fixation in the proximal row of the plate followed by locked fixation.  This was followed by additional fixation within the shaft. My assistant was careful to control alignment throughout by using traction and bending forces. He also  assited with retraction. All wounds were irrigated thoroughly. Final AP and  LAT fluoro images showed restoration of alignment, reduction, and varus/ valgus stability on stress view in 90 degrees of flexion. I also performed 30 degree flexed external rotation because of the posterolateral corner injury and found good stability.   Prior to closure, I turned my attention to the distal edge of the wound here underneath the skin.  Long scissors were used to spread both superficial and deep to the anterior compartment.  The fascia was then released for 8 to 10 cm to reduce the likelihood of the postoperative compartment syndrome.  Once more, wound was irrigated and then a standard layered closure performed, 0 Vicryl, 2-0 Vicryl, and 3-0 nylon for the skin.  Sterile gently compressive dressing was applied and in knee immobilizer.  The patient was taken to the PACU in stable condition.   PROGNOSIS: The patient will be transitioned into a hinged knee brace with unrestricted range of motion and this will begin immediately.  Orders entered for nonweightbearing on the operative extremity, pharmacologic DVT prophylaxis, and mobilization with PT and OT. After discharge, we will plan to see the patient back in about 2 weeks for removal of sutures and we will continue to follow throughout the hospital stay.          Regina AlbinoMichael H. Carola Edwards, M.D.
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
## 2019-04-10 NOTE — Progress Notes (Signed)
Orthopedic Tech Progress Note Patient Details:  Niana Martorana [birth year] [id number] Called in order to [facility] for a [place] Patient ID: Alinah Sheard, female   DOB: Oct 15, 1975, 43 y.o.   MRN: [id number]   Janit Pagan 04/10/2019, 6:18 PM

## 2019-04-11 ENCOUNTER — Inpatient Hospital Stay (HOSPITAL_COMMUNITY): Payer: BC Managed Care – PPO

## 2019-04-11 ENCOUNTER — Encounter (HOSPITAL_COMMUNITY): Payer: Self-pay | Admitting: Orthopedic Surgery

## 2019-04-11 ENCOUNTER — Other Ambulatory Visit: Payer: Self-pay

## 2019-04-11 LAB — CBC
HCT: 29 % — ABNORMAL LOW (ref 36.0–46.0)
Hemoglobin: 9.7 g/dL — ABNORMAL LOW (ref 12.0–15.0)
MCH: 31.4 pg (ref 26.0–34.0)
MCHC: 33.4 g/dL (ref 30.0–36.0)
MCV: 93.9 fL (ref 80.0–100.0)
Platelets: 243 10*3/uL (ref 150–400)
RBC: 3.09 MIL/uL — ABNORMAL LOW (ref 3.87–5.11)
RDW: 13.3 % (ref 11.5–15.5)
WBC: 7.3 10*3/uL (ref 4.0–10.5)
nRBC: 0 % (ref 0.0–0.2)

## 2019-04-11 LAB — BASIC METABOLIC PANEL
Anion gap: 5 (ref 5–15)
BUN: 7 mg/dL (ref 6–20)
CO2: 27 mmol/L (ref 22–32)
Calcium: 7 mg/dL — ABNORMAL LOW (ref 8.9–10.3)
Chloride: 105 mmol/L (ref 98–111)
Creatinine, Ser: 0.38 mg/dL — ABNORMAL LOW (ref 0.44–1.00)
GFR calc Af Amer: >60 mL/min (ref >60)
GFR calc non Af Amer: >60 mL/min (ref >60)
Glucose, Bld: 153 mg/dL — ABNORMAL HIGH (ref 70–99)
Potassium: 3.3 mmol/L — ABNORMAL LOW (ref 3.5–5.1)
Sodium: 137 mmol/L (ref 135–145)

## 2019-04-11 LAB — GLUCOSE, CAPILLARY
Glucose-Capillary: 125 mg/dL — ABNORMAL HIGH (ref 70–99)
Glucose-Capillary: 137 mg/dL — ABNORMAL HIGH (ref 70–99)
Glucose-Capillary: 155 mg/dL — ABNORMAL HIGH (ref 70–99)
Glucose-Capillary: 163 mg/dL — ABNORMAL HIGH (ref 70–99)
Glucose-Capillary: 122 mg/dL — ABNORMAL HIGH (ref 70–99)
Glucose-Capillary: 124 mg/dL — ABNORMAL HIGH (ref 70–99)

## 2019-04-11 MED ORDER — CLONAZEPAM 1 MG PO TABS
1.0000 mg | ORAL_TABLET | Freq: Two times a day (BID) | ORAL | Status: DC
  Administered 2019-04-11 – 2019-04-14 (×8): 1 mg
  Filled 2019-04-11 (×7): qty 1
  Filled 2019-04-11: qty 2

## 2019-04-11 MED ORDER — POTASSIUM CHLORIDE 20 MEQ/15ML (10%) PO SOLN
40.0000 meq | Freq: Three times a day (TID) | ORAL | Status: DC
  Administered 2019-04-11 (×3): 40 meq via ORAL
  Filled 2019-04-11 (×4): qty 30

## 2019-04-11 MED ORDER — QUETIAPINE FUMARATE 100 MG PO TABS
100.0000 mg | ORAL_TABLET | Freq: Two times a day (BID) | ORAL | Status: DC
  Administered 2019-04-11 – 2019-04-14 (×8): 100 mg
  Filled 2019-04-11 (×8): qty 1

## 2019-04-11 MED ORDER — ENOXAPARIN SODIUM 40 MG/0.4ML ~~LOC~~ SOLN
40.0000 mg | SUBCUTANEOUS | Status: DC
  Administered 2019-04-11 – 2019-04-21 (×11): 40 mg via SUBCUTANEOUS
  Filled 2019-04-11 (×12): qty 0.4
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
## 2019-04-11 NOTE — Anesthesia Postprocedure Evaluation (Signed)
Anesthesia Post Note  Patient: Regina Edwards  Procedure(s) Performed: OPEN REDUCTION INTERNAL FIXATION (ORIF) TIBIAL PLATEAU (Left Leg Lower) REMOVAL EXTERNAL FIXATION LEG (Left Leg Lower) Application Of Wound Vac (Left Leg Lower)     Anesthesia Type: General    Last Vitals:  Vitals:   04/11/19 0724 04/11/19 0725  BP:    Pulse: (!) 103   Resp: 17   Temp: (!) 38.3 C   SpO2: 99% 99%    Last Pain:  Vitals:   04/11/19 0400  TempSrc: Bladder  PainSc:                  Adlyn Fife DAVID

## 2019-04-11 NOTE — Progress Notes (Signed)
RT NOTE: Patient switched back to full support because of high RR in the 50's and agitation.
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
## 2019-04-12 ENCOUNTER — Inpatient Hospital Stay (HOSPITAL_COMMUNITY): Payer: BC Managed Care – PPO

## 2019-04-12 LAB — BPAM RBC
Blood Product Expiration Date: 202010072359
Blood Product Expiration Date: 202010072359
ISSUE DATE / TIME: 202009012255
ISSUE DATE / TIME: 202009012255
Unit Type and Rh: 5100
Unit Type and Rh: 5100

## 2019-04-12 LAB — TYPE AND SCREEN
ABO/RH(D): O POS
Antibody Screen: NEGATIVE
Unit division: 0
Unit division: 0

## 2019-04-12 LAB — GLUCOSE, CAPILLARY
Glucose-Capillary: 155 mg/dL — ABNORMAL HIGH (ref 70–99)
Glucose-Capillary: 135 mg/dL — ABNORMAL HIGH (ref 70–99)
Glucose-Capillary: 131 mg/dL — ABNORMAL HIGH (ref 70–99)
Glucose-Capillary: 111 mg/dL — ABNORMAL HIGH (ref 70–99)
Glucose-Capillary: 101 mg/dL — ABNORMAL HIGH (ref 70–99)
Glucose-Capillary: 123 mg/dL — ABNORMAL HIGH (ref 70–99)

## 2019-04-12 LAB — BASIC METABOLIC PANEL
Anion gap: 6 (ref 5–15)
BUN: 10 mg/dL (ref 6–20)
CO2: 26 mmol/L (ref 22–32)
Calcium: 7.2 mg/dL — ABNORMAL LOW (ref 8.9–10.3)
Chloride: 106 mmol/L (ref 98–111)
Creatinine, Ser: 0.38 mg/dL — ABNORMAL LOW (ref 0.44–1.00)
GFR calc Af Amer: >60 mL/min (ref >60)
GFR calc non Af Amer: >60 mL/min (ref >60)
Glucose, Bld: 135 mg/dL — ABNORMAL HIGH (ref 70–99)
Potassium: 3.9 mmol/L (ref 3.5–5.1)
Sodium: 138 mmol/L (ref 135–145)

## 2019-04-12 LAB — TRIGLYCERIDES: Triglycerides: 141 mg/dL (ref <150)

## 2019-04-12 LAB — CBC
HCT: 28.6 % — ABNORMAL LOW (ref 36.0–46.0)
Hemoglobin: 9.4 g/dL — ABNORMAL LOW (ref 12.0–15.0)
MCH: 31.9 pg (ref 26.0–34.0)
MCHC: 32.9 g/dL (ref 30.0–36.0)
MCV: 96.9 fL (ref 80.0–100.0)
Platelets: 292 10*3/uL (ref 150–400)
RBC: 2.95 MIL/uL — ABNORMAL LOW (ref 3.87–5.11)
RDW: 13.4 % (ref 11.5–15.5)
WBC: 7.4 10*3/uL (ref 4.0–10.5)
nRBC: 0 % (ref 0.0–0.2)

## 2019-04-12 MED ORDER — POTASSIUM CHLORIDE 20 MEQ/15ML (10%) PO SOLN
40.0000 meq | Freq: Three times a day (TID) | ORAL | Status: DC
  Administered 2019-04-12 (×2): 40 meq
  Filled 2019-04-12 (×3): qty 30
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
## 2019-04-13 LAB — GLUCOSE, CAPILLARY
Glucose-Capillary: 153 mg/dL — ABNORMAL HIGH (ref 70–99)
Glucose-Capillary: 129 mg/dL — ABNORMAL HIGH (ref 70–99)
Glucose-Capillary: 104 mg/dL — ABNORMAL HIGH (ref 70–99)
Glucose-Capillary: 115 mg/dL — ABNORMAL HIGH (ref 70–99)
Glucose-Capillary: 115 mg/dL — ABNORMAL HIGH (ref 70–99)

## 2019-04-13 MED ORDER — KCL IN DEXTROSE-NACL 20-5-0.45 MEQ/L-%-% IV SOLN
INTRAVENOUS | Status: DC
  Administered 2019-04-13 – 2019-04-18 (×7): via INTRAVENOUS
  Filled 2019-04-13 (×9): qty 1000
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
## 2019-04-14 ENCOUNTER — Inpatient Hospital Stay: Payer: Self-pay

## 2019-04-14 ENCOUNTER — Inpatient Hospital Stay (HOSPITAL_COMMUNITY): Payer: BC Managed Care – PPO

## 2019-04-14 LAB — COMPREHENSIVE METABOLIC PANEL
ALT: 147 U/L — ABNORMAL HIGH (ref 0–44)
AST: 114 U/L — ABNORMAL HIGH (ref 15–41)
Albumin: 1.3 g/dL — ABNORMAL LOW (ref 3.5–5.0)
Alkaline Phosphatase: 132 U/L — ABNORMAL HIGH (ref 38–126)
Anion gap: 6 (ref 5–15)
BUN: 8 mg/dL (ref 6–20)
CO2: 25 mmol/L (ref 22–32)
Calcium: 7.3 mg/dL — ABNORMAL LOW (ref 8.9–10.3)
Chloride: 104 mmol/L (ref 98–111)
Creatinine, Ser: 0.4 mg/dL — ABNORMAL LOW (ref 0.44–1.00)
GFR calc Af Amer: >60 mL/min (ref >60)
GFR calc non Af Amer: >60 mL/min (ref >60)
Glucose, Bld: 167 mg/dL — ABNORMAL HIGH (ref 70–99)
Potassium: 4.1 mmol/L (ref 3.5–5.1)
Sodium: 135 mmol/L (ref 135–145)
Total Bilirubin: 0.5 mg/dL (ref 0.3–1.2)
Total Protein: 4.2 g/dL — ABNORMAL LOW (ref 6.5–8.1)

## 2019-04-14 LAB — GLUCOSE, CAPILLARY
Glucose-Capillary: 130 mg/dL — ABNORMAL HIGH (ref 70–99)
Glucose-Capillary: 133 mg/dL — ABNORMAL HIGH (ref 70–99)
Glucose-Capillary: 135 mg/dL — ABNORMAL HIGH (ref 70–99)
Glucose-Capillary: 141 mg/dL — ABNORMAL HIGH (ref 70–99)
Glucose-Capillary: 142 mg/dL — ABNORMAL HIGH (ref 70–99)
Glucose-Capillary: 142 mg/dL — ABNORMAL HIGH (ref 70–99)

## 2019-04-14 LAB — CBC
HCT: 28.3 % — ABNORMAL LOW (ref 36.0–46.0)
Hemoglobin: 9.2 g/dL — ABNORMAL LOW (ref 12.0–15.0)
MCH: 31.3 pg (ref 26.0–34.0)
MCHC: 32.5 g/dL (ref 30.0–36.0)
MCV: 96.3 fL (ref 80.0–100.0)
Platelets: 395 10*3/uL (ref 150–400)
RBC: 2.94 MIL/uL — ABNORMAL LOW (ref 3.87–5.11)
RDW: 13.1 % (ref 11.5–15.5)
WBC: 9.5 10*3/uL (ref 4.0–10.5)
nRBC: 0 % (ref 0.0–0.2)

## 2019-04-14 MED ORDER — SODIUM CHLORIDE 0.9% FLUSH: 10.0000 mL | INTRAVENOUS | Status: DC | PRN

## 2019-04-14 MED ORDER — SODIUM CHLORIDE 0.9% FLUSH
10.0000 mL | Freq: Two times a day (BID) | INTRAVENOUS | Status: DC
  Administered 2019-04-14: 20 mL
  Administered 2019-04-14 – 2019-04-19 (×8): 10 mL

## 2019-04-14 MED ORDER — CHLORHEXIDINE GLUCONATE CLOTH 2 % EX PADS
6.0000 | MEDICATED_PAD | Freq: Every day | CUTANEOUS | Status: DC
  Administered 2019-04-14 – 2019-04-20 (×6): 6 via TOPICAL

## 2019-04-14 MED ORDER — PIVOT 1.5 CAL PO LIQD
1000.0000 mL | ORAL | Status: DC
  Administered 2019-04-14 – 2019-04-16 (×2): 1000 mL
  Filled 2019-04-14 (×2): qty 1000
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
## 2019-04-14 NOTE — Progress Notes (Signed)
Due to patient's need for multiple meds for a prolonged period of time, RN to request a PICC.

## 2019-04-14 NOTE — Progress Notes (Signed)
Peripherally Inserted Central Catheter/Midline Placement  The IV Nurse has discussed with the patient and/or persons authorized to consent for the patient, the purpose of this procedure and the potential benefits and risks involved with this procedure.  The benefits include less needle sticks, lab draws from the catheter, and the patient may be discharged home with the catheter. Risks include, but not limited to, infection, bleeding, blood clot (thrombus formation), and puncture of an artery; nerve damage and irregular heartbeat and possibility to perform a PICC exchange if needed/ordered by physician.  Alternatives to this procedure were also discussed.  Bard Power PICC patient education guide, fact sheet on infection prevention and patient information card has been provided to patient /or left at bedside.    PICC/Midline Placement Documentation  PICC Double Lumen 04/14/19 PICC Right Cephalic 38 cm 1 cm (Active)  Indication for Insertion or Continuance of Line Limited venous access - need for IV therapy >5 days (PICC only) 04/14/19 1126  Exposed Catheter (cm) 1 cm 04/14/19 1126  Site Assessment Clean;Dry;Intact 04/14/19 1126  Lumen #1 Status Flushed;Blood return noted 04/14/19 1126  Lumen #2 Status Flushed;Blood return noted 04/14/19 1126  Dressing Type Transparent 04/14/19 1126  Dressing Status Clean;Dry;Intact;Antimicrobial disc in place;Other (Comment) 04/14/19 1126  Dressing Intervention New dressing 04/14/19 1126  Dressing Change Due 04/21/19 04/14/19 1126   Telephone consent signed by husband    Regina Edwards 04/14/2019, 11:27 AM
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
## 2019-04-15 LAB — T4, FREE: Free T4: <0.25 ng/dL — ABNORMAL LOW (ref 0.61–1.12)

## 2019-04-15 LAB — BASIC METABOLIC PANEL
Anion gap: 8 (ref 5–15)
BUN: 10 mg/dL (ref 6–20)
CO2: 26 mmol/L (ref 22–32)
Calcium: 7.5 mg/dL — ABNORMAL LOW (ref 8.9–10.3)
Chloride: 104 mmol/L (ref 98–111)
Creatinine, Ser: 0.39 mg/dL — ABNORMAL LOW (ref 0.44–1.00)
GFR calc Af Amer: >60 mL/min (ref >60)
GFR calc non Af Amer: >60 mL/min (ref >60)
Glucose, Bld: 149 mg/dL — ABNORMAL HIGH (ref 70–99)
Potassium: 3.9 mmol/L (ref 3.5–5.1)
Sodium: 138 mmol/L (ref 135–145)

## 2019-04-15 LAB — GLUCOSE, CAPILLARY
Glucose-Capillary: 127 mg/dL — ABNORMAL HIGH (ref 70–99)
Glucose-Capillary: 131 mg/dL — ABNORMAL HIGH (ref 70–99)
Glucose-Capillary: 137 mg/dL — ABNORMAL HIGH (ref 70–99)
Glucose-Capillary: 139 mg/dL — ABNORMAL HIGH (ref 70–99)
Glucose-Capillary: 140 mg/dL — ABNORMAL HIGH (ref 70–99)
Glucose-Capillary: 145 mg/dL — ABNORMAL HIGH (ref 70–99)
Glucose-Capillary: 169 mg/dL — ABNORMAL HIGH (ref 70–99)

## 2019-04-15 LAB — TSH: TSH: 26.629 u[IU]/mL — ABNORMAL HIGH (ref 0.350–4.500)

## 2019-04-15 LAB — TRIGLYCERIDES: Triglycerides: 180 mg/dL — ABNORMAL HIGH

## 2019-04-15 MED ORDER — OXYCODONE HCL 5 MG/5ML PO SOLN
10.0000 mg | Freq: Four times a day (QID) | ORAL | Status: DC
  Administered 2019-04-15 – 2019-04-16 (×5): 10 mg
  Filled 2019-04-15 (×5): qty 10

## 2019-04-15 MED ORDER — LEVOTHYROXINE SODIUM 100 MCG/5ML IV SOLN
75.0000 ug | Freq: Every day | INTRAVENOUS | Status: DC
  Administered 2019-04-15 – 2019-04-21 (×7): 75 ug via INTRAVENOUS
  Filled 2019-04-15 (×7): qty 5

## 2019-04-15 MED ORDER — FUROSEMIDE 10 MG/ML IJ SOLN
20.0000 mg | Freq: Once | INTRAMUSCULAR | Status: AC
  Administered 2019-04-15: 20 mg via INTRAVENOUS
  Filled 2019-04-15: qty 2

## 2019-04-15 MED ORDER — CLONAZEPAM 1 MG PO TABS
2.0000 mg | ORAL_TABLET | Freq: Two times a day (BID) | ORAL | Status: DC
  Administered 2019-04-15 (×2): 2 mg
  Filled 2019-04-15 (×3): qty 2

## 2019-04-15 MED ORDER — QUETIAPINE FUMARATE 200 MG PO TABS
200.0000 mg | ORAL_TABLET | Freq: Two times a day (BID) | ORAL | Status: DC
  Administered 2019-04-15 (×2): 200 mg
  Filled 2019-04-15 (×3): qty 1

## 2019-04-15 NOTE — Progress Notes (Signed)
Removed chest tube per Trauma MD order. Dressed with gauze and pressure tape.
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
## 2019-04-15 NOTE — Progress Notes (Signed)
Chest tube output from 9/7 0600- 9/8 0600 is 53mLs

## 2019-04-15 NOTE — Progress Notes (Signed)
Patient ID: Regina Edwards, female   DOB: Apr 01, 1976, 43 y.o.   MRN: [id number] I met with her husband at the bedside and updated him on the plan of care.  Georganna Skeans, MD, MPH, FACS Trauma & General Surgery: [phone]

## 2019-04-15 NOTE — Progress Notes (Signed)
Attempts to wean sedation for WUA resulted in agitation, BP to the 200s, and attempts by the pt to grab the ETT. Fentanyl bolus given. Sedation dose increased slightly, but lower than previous rate. On current sedation, breathing over the vent and following commands. WUA to be re-attempted later this morning.
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
## 2019-04-15 NOTE — Progress Notes (Signed)
Removed an additional dose of seroquel and klonopin after dropping initial doses on the floor when giving 1000 meds.

## 2019-04-16 ENCOUNTER — Inpatient Hospital Stay (HOSPITAL_COMMUNITY): Payer: BC Managed Care – PPO

## 2019-04-16 LAB — GLUCOSE, CAPILLARY
Glucose-Capillary: 110 mg/dL — ABNORMAL HIGH (ref 70–99)
Glucose-Capillary: 129 mg/dL — ABNORMAL HIGH (ref 70–99)
Glucose-Capillary: 137 mg/dL — ABNORMAL HIGH (ref 70–99)
Glucose-Capillary: 140 mg/dL — ABNORMAL HIGH (ref 70–99)
Glucose-Capillary: 141 mg/dL — ABNORMAL HIGH (ref 70–99)
Glucose-Capillary: 99 mg/dL (ref 70–99)

## 2019-04-16 LAB — BASIC METABOLIC PANEL
Anion gap: 7 (ref 5–15)
BUN: 10 mg/dL (ref 6–20)
CO2: 25 mmol/L (ref 22–32)
Calcium: 7.3 mg/dL — ABNORMAL LOW (ref 8.9–10.3)
Chloride: 104 mmol/L (ref 98–111)
Creatinine, Ser: 0.36 mg/dL — ABNORMAL LOW (ref 0.44–1.00)
GFR calc Af Amer: >60 mL/min (ref >60)
GFR calc non Af Amer: >60 mL/min (ref >60)
Glucose, Bld: 160 mg/dL — ABNORMAL HIGH (ref 70–99)
Potassium: 4 mmol/L (ref 3.5–5.1)
Sodium: 136 mmol/L (ref 135–145)

## 2019-04-16 LAB — CBC
HCT: 28.5 % — ABNORMAL LOW (ref 36.0–46.0)
Hemoglobin: 9.1 g/dL — ABNORMAL LOW (ref 12.0–15.0)
MCH: 31.5 pg (ref 26.0–34.0)
MCHC: 31.9 g/dL (ref 30.0–36.0)
MCV: 98.6 fL (ref 80.0–100.0)
Platelets: 514 10*3/uL — ABNORMAL HIGH (ref 150–400)
RBC: 2.89 MIL/uL — ABNORMAL LOW (ref 3.87–5.11)
RDW: 13.3 % (ref 11.5–15.5)
WBC: 12.5 10*3/uL — ABNORMAL HIGH (ref 4.0–10.5)
nRBC: 0 % (ref 0.0–0.2)

## 2019-04-16 LAB — T3, FREE: T3, Free: 0.7 pg/mL — ABNORMAL LOW (ref 2.0–4.4)

## 2019-04-16 MED ORDER — OXYCODONE HCL 5 MG/5ML PO SOLN
10.0000 mg | Freq: Four times a day (QID) | ORAL | Status: DC
  Administered 2019-04-16 – 2019-04-18 (×4): 10 mg via ORAL
  Filled 2019-04-16 (×4): qty 10

## 2019-04-16 MED ORDER — QUETIAPINE FUMARATE 100 MG PO TABS
100.0000 mg | ORAL_TABLET | Freq: Two times a day (BID) | ORAL | Status: DC
  Administered 2019-04-16 – 2019-04-17 (×2): 100 mg via ORAL
  Filled 2019-04-16 (×4): qty 1

## 2019-04-16 MED ORDER — HYDROMORPHONE HCL 1 MG/ML IJ SOLN
0.5000 mg | INTRAMUSCULAR | Status: DC | PRN
  Administered 2019-04-16 – 2019-04-20 (×10): 1 mg via INTRAVENOUS
  Filled 2019-04-16 (×10): qty 1

## 2019-04-16 MED ORDER — ACETAMINOPHEN 160 MG/5ML PO SOLN: 650.0000 mg | Freq: Four times a day (QID) | ORAL | Status: DC | PRN

## 2019-04-16 MED ORDER — DOCUSATE SODIUM 50 MG/5ML PO LIQD
100.0000 mg | Freq: Two times a day (BID) | ORAL | Status: DC | PRN
  Administered 2019-04-20: 15:00:00 100 mg via ORAL
  Filled 2019-04-16: qty 10

## 2019-04-16 MED ORDER — CLONAZEPAM 1 MG PO TABS
1.0000 mg | ORAL_TABLET | Freq: Two times a day (BID) | ORAL | Status: DC
  Administered 2019-04-16 – 2019-04-17 (×2): 1 mg via ORAL
  Filled 2019-04-16 (×4): qty 1

## 2019-04-16 MED ORDER — LORAZEPAM 2 MG/ML IJ SOLN
2.0000 mg | INTRAMUSCULAR | Status: DC | PRN
  Administered 2019-04-16 (×2): 2 mg via INTRAVENOUS
  Filled 2019-04-16 (×2): qty 1

## 2019-04-16 MED ORDER — FUROSEMIDE 10 MG/ML IJ SOLN
40.0000 mg | Freq: Once | INTRAMUSCULAR | Status: AC
  Administered 2019-04-16: 40 mg via INTRAVENOUS
  Filled 2019-04-16: qty 4
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
## 2019-04-16 NOTE — Procedures (Signed)
Extubation Procedure Note  Patient Details:   Name: Regina Edwards DOB: 01/08/1976 MRN: [id number]   Airway Documentation:    Vent end date: 04/16/19 Vent end time: 0835   Evaluation  O2 sats: stable throughout Complications: No apparent complications Patient did tolerate procedure well. Bilateral Breath Sounds: Clear, Diminished   Yes   Pt extubated to 4L  per MD Order. Positive cuff leak noted prior to extubation. Pt vomited copious amounts of Green/Brown vomit with food particles in it. MD made aware. Pt will be monitored for possible aspiration. VS within normal limits. Pt able to speak and has a weak non productive cough post extubation. RT will closely monitor pt  Jesse Sans 04/16/2019, 9:37 AM

## 2019-04-17 LAB — BASIC METABOLIC PANEL
Anion gap: 8 (ref 5–15)
BUN: 6 mg/dL (ref 6–20)
CO2: 25 mmol/L (ref 22–32)
Calcium: 7.9 mg/dL — ABNORMAL LOW (ref 8.9–10.3)
Chloride: 103 mmol/L (ref 98–111)
Creatinine, Ser: 0.33 mg/dL — ABNORMAL LOW (ref 0.44–1.00)
GFR calc Af Amer: >60 mL/min (ref >60)
GFR calc non Af Amer: >60 mL/min (ref >60)
Glucose, Bld: 126 mg/dL — ABNORMAL HIGH (ref 70–99)
Potassium: 3.7 mmol/L (ref 3.5–5.1)
Sodium: 136 mmol/L (ref 135–145)

## 2019-04-17 LAB — URINALYSIS, ROUTINE W REFLEX MICROSCOPIC
Bilirubin Urine: NEGATIVE
Glucose, UA: NEGATIVE mg/dL
Hgb urine dipstick: NEGATIVE
Ketones, ur: NEGATIVE mg/dL
Nitrite: NEGATIVE
Protein, ur: NEGATIVE mg/dL
Specific Gravity, Urine: 1.002 — ABNORMAL LOW (ref 1.005–1.030)
pH: 8 (ref 5.0–8.0)

## 2019-04-17 LAB — GLUCOSE, CAPILLARY
Glucose-Capillary: 105 mg/dL — ABNORMAL HIGH (ref 70–99)
Glucose-Capillary: 115 mg/dL — ABNORMAL HIGH (ref 70–99)
Glucose-Capillary: 117 mg/dL — ABNORMAL HIGH (ref 70–99)
Glucose-Capillary: 101 mg/dL — ABNORMAL HIGH (ref 70–99)
Glucose-Capillary: 92 mg/dL (ref 70–99)
Glucose-Capillary: 96 mg/dL (ref 70–99)

## 2019-04-17 LAB — CBC
HCT: 30.1 % — ABNORMAL LOW (ref 36.0–46.0)
Hemoglobin: 9.8 g/dL — ABNORMAL LOW (ref 12.0–15.0)
MCH: 30.7 pg (ref 26.0–34.0)
MCHC: 32.6 g/dL (ref 30.0–36.0)
MCV: 94.4 fL (ref 80.0–100.0)
Platelets: 644 10*3/uL — ABNORMAL HIGH (ref 150–400)
RBC: 3.19 MIL/uL — ABNORMAL LOW (ref 3.87–5.11)
RDW: 12.9 % (ref 11.5–15.5)
WBC: 15 10*3/uL — ABNORMAL HIGH (ref 4.0–10.5)
nRBC: 0 % (ref 0.0–0.2)

## 2019-04-17 MED ORDER — LORAZEPAM 2 MG/ML IJ SOLN: 0.5000 mg | INTRAMUSCULAR | Status: DC | PRN
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
## 2019-04-18 LAB — BASIC METABOLIC PANEL
Anion gap: 7 (ref 5–15)
BUN: 7 mg/dL (ref 6–20)
CO2: 24 mmol/L (ref 22–32)
Calcium: 7.7 mg/dL — ABNORMAL LOW (ref 8.9–10.3)
Chloride: 106 mmol/L (ref 98–111)
Creatinine, Ser: 0.42 mg/dL — ABNORMAL LOW (ref 0.44–1.00)
GFR calc Af Amer: >60 mL/min (ref >60)
GFR calc non Af Amer: >60 mL/min (ref >60)
Glucose, Bld: 225 mg/dL — ABNORMAL HIGH (ref 70–99)
Potassium: 4.3 mmol/L (ref 3.5–5.1)
Sodium: 137 mmol/L (ref 135–145)

## 2019-04-18 LAB — GLUCOSE, CAPILLARY
Glucose-Capillary: 102 mg/dL — ABNORMAL HIGH (ref 70–99)
Glucose-Capillary: 101 mg/dL — ABNORMAL HIGH (ref 70–99)
Glucose-Capillary: 102 mg/dL — ABNORMAL HIGH (ref 70–99)
Glucose-Capillary: 98 mg/dL (ref 70–99)
Glucose-Capillary: 72 mg/dL (ref 70–99)

## 2019-04-18 LAB — CBC
HCT: 29.5 % — ABNORMAL LOW (ref 36.0–46.0)
Hemoglobin: 9.6 g/dL — ABNORMAL LOW (ref 12.0–15.0)
MCH: 30.8 pg (ref 26.0–34.0)
MCHC: 32.5 g/dL (ref 30.0–36.0)
MCV: 94.6 fL (ref 80.0–100.0)
Platelets: 689 K/uL — ABNORMAL HIGH (ref 150–400)
RBC: 3.12 MIL/uL — ABNORMAL LOW (ref 3.87–5.11)
RDW: 13 % (ref 11.5–15.5)
WBC: 12.7 K/uL — ABNORMAL HIGH (ref 4.0–10.5)
nRBC: 0 % (ref 0.0–0.2)

## 2019-04-18 MED ORDER — OXYCODONE HCL 5 MG PO TABS
5.0000 mg | ORAL_TABLET | ORAL | Status: DC | PRN
  Administered 2019-04-18 – 2019-04-21 (×12): 10 mg via ORAL
  Filled 2019-04-18 (×13): qty 2

## 2019-04-18 MED ORDER — LORAZEPAM 2 MG/ML IJ SOLN
0.5000 mg | INTRAMUSCULAR | Status: DC | PRN
  Administered 2019-04-18 – 2019-04-19 (×3): 1 mg via INTRAVENOUS
  Filled 2019-04-18 (×3): qty 1

## 2019-04-18 NOTE — Progress Notes (Signed)
Orthopedic Tech Progress Note Patient Details:  Regina Edwards 08/19/1975 [id number]  Ortho Devices Type of Ortho Device: Prafo boot/shoe Ortho Device/Splint Location: LLE Ortho Device/Splint Interventions: Adjustment, Application, Ordered   Post Interventions Patient Tolerated: Well Instructions Provided: Care of device, Adjustment of device   Samon Dishner L Kailyn Dubie 04/18/2019, 11:16 AM  
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
## 2019-04-18 NOTE — Progress Notes (Signed)
Orthopedic Tech Progress Note Patient Details:  Regina Edwards 09/03/1975 [id number]  Ortho Devices Type of Ortho Device: Prafo boot/shoe Ortho Device/Splint Location: LLE Ortho Device/Splint Interventions: Adjustment, Application, Ordered   Post Interventions Patient Tolerated: Well Instructions Provided: Care of device, Adjustment of device   Janit Pagan 04/18/2019, 11:16 AM
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
## 2019-04-19 ENCOUNTER — Inpatient Hospital Stay (HOSPITAL_COMMUNITY): Payer: BC Managed Care – PPO

## 2019-04-19 LAB — CBC
HCT: 35.6 % — ABNORMAL LOW (ref 36.0–46.0)
Hemoglobin: 11.5 g/dL — ABNORMAL LOW (ref 12.0–15.0)
MCH: 31.1 pg (ref 26.0–34.0)
MCHC: 32.3 g/dL (ref 30.0–36.0)
MCV: 96.2 fL (ref 80.0–100.0)
Platelets: 1054 10*3/uL (ref 150–400)
RBC: 3.7 MIL/uL — ABNORMAL LOW (ref 3.87–5.11)
RDW: 13.2 % (ref 11.5–15.5)
WBC: 17 10*3/uL — ABNORMAL HIGH (ref 4.0–10.5)
nRBC: 0 % (ref 0.0–0.2)

## 2019-04-19 NOTE — Progress Notes (Signed)
CRITICAL VALUE ALERT  Critical Value:  Platelets 1054  Date & Time Notied:  04/19/19   Provider Notified: Thompson     Orders Received/Actions taken: No new orders

## 2019-04-19 NOTE — Progress Notes (Signed)
Rehab Admissions Coordinator Note:  Per PT recommendation, this patient was screened by Jhonnie Garner for appropriateness for an Inpatient Acute Rehab Consult.  At this time, we are recommending Inpatient Rehab consult. AC will contact MD to request order.   Jhonnie Garner 04/19/2019, 12:12 PM  I can be reached at [phone].
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
## 2019-04-19 NOTE — Progress Notes (Signed)
Pt states she thinks she has a UTI. VS have been normal with no fevers or other signs of infection.

## 2019-04-20 ENCOUNTER — Encounter (HOSPITAL_COMMUNITY): Payer: BC Managed Care – PPO

## 2019-04-20 ENCOUNTER — Inpatient Hospital Stay (HOSPITAL_COMMUNITY): Payer: BC Managed Care – PPO

## 2019-04-20 LAB — CBC
HCT: 37.4 % (ref 36.0–46.0)
Hemoglobin: 12.2 g/dL (ref 12.0–15.0)
MCH: 30.4 pg (ref 26.0–34.0)
MCHC: 32.6 g/dL (ref 30.0–36.0)
MCV: 93.3 fL (ref 80.0–100.0)
Platelets: 943 10*3/uL (ref 150–400)
RBC: 4.01 MIL/uL (ref 3.87–5.11)
RDW: 13.3 % (ref 11.5–15.5)
WBC: 12.5 10*3/uL — ABNORMAL HIGH (ref 4.0–10.5)
nRBC: 0 % (ref 0.0–0.2)

## 2019-04-20 LAB — URINALYSIS, ROUTINE W REFLEX MICROSCOPIC
Bilirubin Urine: NEGATIVE
Glucose, UA: NEGATIVE mg/dL
Ketones, ur: NEGATIVE mg/dL
Nitrite: POSITIVE — AB
Protein, ur: 30 mg/dL — AB
Specific Gravity, Urine: 1.018 (ref 1.005–1.030)
pH: 6 (ref 5.0–8.0)

## 2019-04-20 LAB — PREGNANCY, URINE: Preg Test, Ur: NEGATIVE

## 2019-04-20 MED ORDER — ALPRAZOLAM 0.25 MG PO TABS
0.2500 mg | ORAL_TABLET | Freq: Three times a day (TID) | ORAL | Status: DC | PRN
  Administered 2019-04-20: 0.25 mg via ORAL
  Filled 2019-04-20: qty 1

## 2019-04-20 MED ORDER — QUETIAPINE FUMARATE 50 MG PO TABS
25.0000 mg | ORAL_TABLET | Freq: Every day | ORAL | Status: DC
  Administered 2019-04-20: 25 mg via ORAL
  Filled 2019-04-20: qty 1

## 2019-04-20 MED ORDER — IOHEXOL 300 MG/ML  SOLN
100.0000 mL | Freq: Once | INTRAMUSCULAR | Status: AC | PRN
  Administered 2019-04-20: 14:00:00 80 mL via INTRAVENOUS
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
## 2019-04-20 NOTE — Progress Notes (Signed)
Patient refused the removal of the mid abdomen staples. Patient stated, " she would like for her husband to the at her bed side when the staples are been removed. Will pass this information to on coming nurse.
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
## 2019-04-21 ENCOUNTER — Inpatient Hospital Stay (HOSPITAL_COMMUNITY): Payer: BC Managed Care – PPO

## 2019-04-21 ENCOUNTER — Encounter (HOSPITAL_COMMUNITY): Payer: BC Managed Care – PPO

## 2019-04-21 DIAGNOSIS — M7989 Other specified soft tissue disorders: Secondary | ICD-10-CM

## 2019-04-21 LAB — BASIC METABOLIC PANEL
Anion gap: 10 (ref 5–15)
BUN: <5 mg/dL — ABNORMAL LOW (ref 6–20)
CO2: 26 mmol/L (ref 22–32)
Calcium: 8.5 mg/dL — ABNORMAL LOW (ref 8.9–10.3)
Chloride: 100 mmol/L (ref 98–111)
Creatinine, Ser: 0.43 mg/dL — ABNORMAL LOW (ref 0.44–1.00)
GFR calc Af Amer: >60 mL/min (ref >60)
GFR calc non Af Amer: >60 mL/min (ref >60)
Glucose, Bld: 99 mg/dL (ref 70–99)
Potassium: 3.6 mmol/L (ref 3.5–5.1)
Sodium: 136 mmol/L (ref 135–145)

## 2019-04-21 LAB — CBC
HCT: 36.3 % (ref 36.0–46.0)
Hemoglobin: 11.9 g/dL — ABNORMAL LOW (ref 12.0–15.0)
MCH: 31.2 pg (ref 26.0–34.0)
MCHC: 32.8 g/dL (ref 30.0–36.0)
MCV: 95.3 fL (ref 80.0–100.0)
Platelets: 1107 10*3/uL (ref 150–400)
RBC: 3.81 MIL/uL — ABNORMAL LOW (ref 3.87–5.11)
RDW: 13.4 % (ref 11.5–15.5)
WBC: 11 10*3/uL — ABNORMAL HIGH (ref 4.0–10.5)
nRBC: 0 % (ref 0.0–0.2)

## 2019-04-21 MED ORDER — SULFAMETHOXAZOLE-TRIMETHOPRIM 400-80 MG PO TABS
2.0000 | ORAL_TABLET | Freq: Two times a day (BID) | ORAL | Status: DC
  Filled 2019-04-21 (×2): qty 2

## 2019-04-21 MED ORDER — METHOCARBAMOL 500 MG PO TABS: 500.0000 mg | ORAL_TABLET | Freq: Four times a day (QID) | ORAL | 0 refills | Status: DC | PRN

## 2019-04-21 MED ORDER — ENOXAPARIN SODIUM 40 MG/0.4ML ~~LOC~~ SOLN: 40.0000 mg | SUBCUTANEOUS | 0 refills | Status: DC

## 2019-04-21 MED ORDER — ENOXAPARIN (LOVENOX) PATIENT EDUCATION KIT
PACK | Freq: Once | Status: DC
  Filled 2019-04-21: qty 1

## 2019-04-21 MED ORDER — SULFAMETHOXAZOLE-TRIMETHOPRIM 800-160 MG PO TABS: 1.0000 | ORAL_TABLET | Freq: Two times a day (BID) | ORAL | 0 refills | Status: AC

## 2019-04-21 MED ORDER — OXYCODONE HCL 10 MG PO TABS: 5.0000 mg | ORAL_TABLET | ORAL | 0 refills | Status: DC | PRN

## 2019-04-21 MED ORDER — ACETAMINOPHEN 500 MG PO TABS: 1000.0000 mg | ORAL_TABLET | Freq: Four times a day (QID) | ORAL | 2 refills | Status: AC | PRN

## 2019-04-21 MED ORDER — QUETIAPINE FUMARATE 25 MG PO TABS: 25.0000 mg | ORAL_TABLET | Freq: Every day | ORAL | 0 refills | Status: DC

## 2019-04-21 MED ORDER — ONDANSETRON 4 MG PO TBDP: 4.0000 mg | ORAL_TABLET | Freq: Four times a day (QID) | ORAL | 0 refills | Status: DC | PRN

## 2019-04-21 MED ORDER — ALPRAZOLAM 0.25 MG PO TABS: 0.2500 mg | ORAL_TABLET | Freq: Three times a day (TID) | ORAL | 0 refills | Status: DC | PRN

## 2019-04-21 NOTE — Discharge Instructions (Signed)
[facility]      [facility] [phone]  OPEN ABDOMINAL SURGERY: POST OP INSTRUCTIONS  Always review your discharge instruction sheet given to you by the facility where your surgery was performed.  IF YOU HAVE DISABILITY OR FAMILY LEAVE FORMS, YOU MUST BRING THEM TO THE OFFICE FOR PROCESSING.  PLEASE DO NOT GIVE THEM TO YOUR DOCTOR.  1. A prescription for pain medication may be given to you upon discharge.  Take your pain medication as prescribed, if needed.  If narcotic pain medicine is not needed, then you may take acetaminophen (Tylenol) or ibuprofen (Advil) as needed. 2. Take your usually prescribed medications unless otherwise directed. 3. If you need a refill on your pain medication, please contact your pharmacy. They will contact our office to request authorization.  Prescriptions will not be filled after 5pm or on week-ends. 4. You should follow a light diet the first few days after arrival home, such as soup and crackers, pudding, etc.unless your doctor has advised otherwise. A high-fiber, low fat diet can be resumed as tolerated.   Be sure to include lots of fluids daily. Most patients will experience some swelling and bruising on the chest and neck area.  Ice packs will help.  Swelling and bruising can take several days to resolve 5. Most patients will experience some swelling and bruising in the area of the incision. Ice pack will help. Swelling and bruising can take several days to resolve..  6. It is common to experience some constipation if taking pain medication after surgery.  Increasing fluid intake and taking a stool softener will usually help or prevent this problem from occurring.  A mild laxative (Milk of Magnesia or Miralax) should be taken according to package directions if there are no bowel movements after 48 hours. 7.  You may have steri-strips (small skin tapes) in place directly over the incision.  These strips should be left on the skin for 7-10 days.  If your  surgeon used skin glue on the incision, you may shower in 24 hours.  The glue will flake off over the next 2-3 weeks.  Any sutures or staples will be removed at the office during your follow-up visit. You may find that a light gauze bandage over your incision may keep your staples from being rubbed or pulled. You may shower and replace the bandage daily. 8. ACTIVITIES:  You may resume regular (light) daily activities beginning the next day--such as daily self-care, walking, climbing stairs--gradually increasing activities as tolerated.  You may have sexual intercourse when it is comfortable.  Refrain from any heavy lifting or straining until approved by your doctor. a. You may drive when you no longer are taking prescription pain medication, you can comfortably wear a seatbelt, and you can safely maneuver your car and apply brakes b. Return to Work: ___________________________________ 9. You should see your doctor in the office for a follow-up appointment approximately two weeks after your surgery.  Make sure that you call for this appointment within a day or two after you arrive home to insure a convenient appointment time. OTHER INSTRUCTIONS:  _____________________________________________________________ _____________________________________________________________  WHEN TO CALL YOUR DOCTOR: 1. Fever over 101.0 2. Inability to urinate 3. Nausea and/or vomiting 4. Extreme swelling or bruising 5. Continued bleeding from incision. 6. Increased pain, redness, or drainage from the incision. 7. Difficulty swallowing or breathing 8. Muscle cramping or spasms. 9. Numbness or tingling in hands or feet or around lips.  The clinic staff is available to   answer your questions during regular business hours.  Please don't hesitate to call and ask to speak to one of the nurses if you have concerns.  For further questions, please visit [email or web address]   
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
## 2019-04-21 NOTE — Progress Notes (Signed)
Pt with d/c orders. Discharge paperwork reviewed with pt and spouse and all questions answered. IV removed. Prescriptions electronically faxed to pharmacy. Teaching for lovenox completed with return-demonstration. Pt escorted out via wheelchair with all belongings to vehicle.

## 2019-04-21 NOTE — Progress Notes (Signed)
Bilateral lower extremity venous duplex co,pleted. Preliminary results in Chart review CV Proc. [facility], [place] 04/21/2019 1:51 PM

## 2019-04-22 LAB — PATHOLOGIST SMEAR REVIEW

## 2020-10-05 ENCOUNTER — Other Ambulatory Visit (HOSPITAL_COMMUNITY)
Admission: RE | Admit: 2020-10-05 | Discharge: 2020-10-05 | Disposition: A | Discharge status: Home / Self Care | Payer: BC Managed Care – PPO | Source: Ambulatory Visit | Attending: Orthopedic Surgery | Admitting: Orthopedic Surgery

## 2020-10-05 DIAGNOSIS — Z20822 Contact with and (suspected) exposure to covid-19: Secondary | ICD-10-CM | POA: Diagnosis not present

## 2020-10-05 DIAGNOSIS — Z79890 Hormone replacement therapy (postmenopausal): Secondary | ICD-10-CM | POA: Diagnosis not present

## 2020-10-05 DIAGNOSIS — Z01812 Encounter for preprocedural laboratory examination: Secondary | ICD-10-CM | POA: Insufficient documentation

## 2020-10-05 DIAGNOSIS — T8484XA Pain due to internal orthopedic prosthetic devices, implants and grafts, initial encounter: Secondary | ICD-10-CM | POA: Diagnosis present

## 2020-10-05 DIAGNOSIS — Y831 Surgical operation with implant of artificial internal device as the cause of abnormal reaction of the patient, or of later complication, without mention of misadventure at the time of the procedure: Secondary | ICD-10-CM | POA: Diagnosis not present

## 2020-10-05 DIAGNOSIS — Z79899 Other long term (current) drug therapy: Secondary | ICD-10-CM | POA: Diagnosis not present

## 2020-10-05 LAB — SARS CORONAVIRUS 2 (TAT 6-24 HRS): SARS Coronavirus 2: NEGATIVE

## 2020-10-06 ENCOUNTER — Other Ambulatory Visit: Payer: Self-pay

## 2020-10-06 ENCOUNTER — Encounter (HOSPITAL_COMMUNITY): Payer: Self-pay | Admitting: Orthopedic Surgery

## 2020-10-06 NOTE — Progress Notes (Signed)
PCP - Ardell Isaacs, NP Cardiologist -   Chest x-ray -  EKG -  Stress Test -  ECHO -  Cardiac Cath -   COVID TEST- 10/05/20  Anesthesia review: n/a  -------------  SDW INSTRUCTIONS:  Your procedure is scheduled on 10/07/20. Please report to [facility] Main Entrance "A" at 07:30 A.M., and check in at the Admitting office. Call this number if you have problems the morning of surgery: [phone]   Remember: Do not eat or drink after midnight the night before your surgery   Medications to take morning of surgery with a sip of water include: levothyroxine (SYNTHROID) acetaminophen (TYLENOL) as needed  As of today, STOP taking any Aspirin (unless otherwise instructed by your surgeon), Aleve, Naproxen, Ibuprofen, Motrin, Advil, Goody's, BC's, all herbal medications, fish oil, and all vitamins.    The Morning of Surgery Do not wear jewelry, make-up or nail polish. Do not wear lotions, powders, or perfumes, or deodorant Do not shave 48 hours prior to surgery.   Do not bring valuables to the hospital. [facility] is not responsible for any belongings or valuables. If you are a smoker, DO NOT Smoke 24 hours prior to surgery If you wear a CPAP at night please bring your mask the morning of surgery  Remember that you must have someone to transport you home after your surgery, and remain with you for 24 hours if you are discharged the same day. Please bring cases for contacts, glasses, hearing aids, dentures or bridgework because it cannot be worn into surgery.   Patients discharged the day of surgery will not be allowed to drive home.   Please shower the NIGHT BEFORE SURGERY and the MORNING OF SURGERY with DIAL Soap. Wear comfortable clothes the morning of surgery. Oral Hygiene is also important to reduce your risk of infection.  Remember - BRUSH YOUR TEETH THE MORNING OF SURGERY WITH YOUR REGULAR TOOTHPASTE  Patient denies shortness of breath, fever, cough and chest pain.
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
## 2020-10-07 ENCOUNTER — Encounter (HOSPITAL_COMMUNITY)
Admission: RE | Disposition: A | Discharge status: Home / Self Care | Payer: Self-pay | Source: Home / Self Care | Attending: Orthopedic Surgery

## 2020-10-07 ENCOUNTER — Ambulatory Visit (HOSPITAL_COMMUNITY)
Admission: RE | Admit: 2020-10-07 | Discharge: 2020-10-07 | Disposition: A | Discharge status: Home / Self Care | Payer: BC Managed Care – PPO | Attending: Orthopedic Surgery | Admitting: Orthopedic Surgery

## 2020-10-07 ENCOUNTER — Ambulatory Visit (HOSPITAL_COMMUNITY): Payer: BC Managed Care – PPO | Admitting: Anesthesiology

## 2020-10-07 ENCOUNTER — Encounter (HOSPITAL_COMMUNITY): Payer: Self-pay | Admitting: Orthopedic Surgery

## 2020-10-07 ENCOUNTER — Ambulatory Visit (HOSPITAL_COMMUNITY): Payer: BC Managed Care – PPO

## 2020-10-07 ENCOUNTER — Other Ambulatory Visit: Payer: Self-pay

## 2020-10-07 DIAGNOSIS — T8484XA Pain due to internal orthopedic prosthetic devices, implants and grafts, initial encounter: Secondary | ICD-10-CM | POA: Diagnosis not present

## 2020-10-07 DIAGNOSIS — Z79890 Hormone replacement therapy (postmenopausal): Secondary | ICD-10-CM | POA: Insufficient documentation

## 2020-10-07 DIAGNOSIS — Z20822 Contact with and (suspected) exposure to covid-19: Secondary | ICD-10-CM | POA: Insufficient documentation

## 2020-10-07 DIAGNOSIS — Z79899 Other long term (current) drug therapy: Secondary | ICD-10-CM | POA: Insufficient documentation

## 2020-10-07 DIAGNOSIS — Z419 Encounter for procedure for purposes other than remedying health state, unspecified: Secondary | ICD-10-CM

## 2020-10-07 DIAGNOSIS — Y831 Surgical operation with implant of artificial internal device as the cause of abnormal reaction of the patient, or of later complication, without mention of misadventure at the time of the procedure: Secondary | ICD-10-CM | POA: Insufficient documentation

## 2020-10-07 HISTORY — PX: HARDWARE REMOVAL: SHX979

## 2020-10-07 LAB — CBC
HCT: 42.3 % (ref 36.0–46.0)
Hemoglobin: 14.6 g/dL (ref 12.0–15.0)
MCH: 31.7 pg (ref 26.0–34.0)
MCHC: 34.5 g/dL (ref 30.0–36.0)
MCV: 92 fL (ref 80.0–100.0)
Platelets: 254 10*3/uL (ref 150–400)
RBC: 4.6 MIL/uL (ref 3.87–5.11)
RDW: 12.1 % (ref 11.5–15.5)
WBC: 5.9 10*3/uL (ref 4.0–10.5)
nRBC: 0 % (ref 0.0–0.2)

## 2020-10-07 LAB — POCT PREGNANCY, URINE: Preg Test, Ur: NEGATIVE

## 2020-10-07 SURGERY — REMOVAL, HARDWARE
Anesthesia: General | Laterality: Left

## 2020-10-07 MED ORDER — ACETAMINOPHEN 500 MG PO TABS
1000.0000 mg | ORAL_TABLET | Freq: Once | ORAL | Status: AC
  Administered 2020-10-07: 1000 mg via ORAL
  Filled 2020-10-07: qty 2

## 2020-10-07 MED ORDER — PROPOFOL 10 MG/ML IV BOLUS
INTRAVENOUS | Status: DC | PRN
  Administered 2020-10-07: 150 mg via INTRAVENOUS

## 2020-10-07 MED ORDER — PROPOFOL 10 MG/ML IV BOLUS
INTRAVENOUS | Status: AC
  Filled 2020-10-07: qty 20

## 2020-10-07 MED ORDER — FENTANYL CITRATE (PF) 250 MCG/5ML IJ SOLN
INTRAMUSCULAR | Status: DC | PRN
  Administered 2020-10-07: 50 ug via INTRAVENOUS
  Administered 2020-10-07: 100 ug via INTRAVENOUS

## 2020-10-07 MED ORDER — ONDANSETRON HCL 4 MG/2ML IJ SOLN
INTRAMUSCULAR | Status: AC
  Filled 2020-10-07: qty 2

## 2020-10-07 MED ORDER — DEXAMETHASONE SODIUM PHOSPHATE 10 MG/ML IJ SOLN
INTRAMUSCULAR | Status: DC | PRN
  Administered 2020-10-07: 4 mg via INTRAVENOUS

## 2020-10-07 MED ORDER — MIDAZOLAM HCL 2 MG/2ML IJ SOLN
INTRAMUSCULAR | Status: DC | PRN
  Administered 2020-10-07: 2 mg via INTRAVENOUS

## 2020-10-07 MED ORDER — FENTANYL CITRATE (PF) 100 MCG/2ML IJ SOLN
25.0000 ug | INTRAMUSCULAR | Status: DC | PRN
  Administered 2020-10-07: 25 ug via INTRAVENOUS

## 2020-10-07 MED ORDER — KETOROLAC TROMETHAMINE 10 MG PO TABS: 10.0000 mg | ORAL_TABLET | Freq: Four times a day (QID) | ORAL | 0 refills | Status: AC | PRN

## 2020-10-07 MED ORDER — ONDANSETRON HCL 4 MG/2ML IJ SOLN
INTRAMUSCULAR | Status: DC | PRN
  Administered 2020-10-07: 4 mg via INTRAVENOUS

## 2020-10-07 MED ORDER — CEFAZOLIN SODIUM-DEXTROSE 2-4 GM/100ML-% IV SOLN
2.0000 g | INTRAVENOUS | Status: AC
  Administered 2020-10-07: 2 g via INTRAVENOUS
  Filled 2020-10-07: qty 100

## 2020-10-07 MED ORDER — LIDOCAINE 2% (20 MG/ML) 5 ML SYRINGE
INTRAMUSCULAR | Status: AC
  Filled 2020-10-07: qty 5

## 2020-10-07 MED ORDER — PHENYLEPHRINE 40 MCG/ML (10ML) SYRINGE FOR IV PUSH (FOR BLOOD PRESSURE SUPPORT)
PREFILLED_SYRINGE | INTRAVENOUS | Status: AC
  Filled 2020-10-07: qty 10

## 2020-10-07 MED ORDER — EPHEDRINE SULFATE 50 MG/ML IJ SOLN
INTRAMUSCULAR | Status: DC | PRN
  Administered 2020-10-07: 5 mg via INTRAVENOUS

## 2020-10-07 MED ORDER — MELOXICAM 7.5 MG PO TABS
15.0000 mg | ORAL_TABLET | Freq: Once | ORAL | Status: AC
  Administered 2020-10-07: 15 mg via ORAL
  Filled 2020-10-07: qty 1
  Filled 2020-10-07: qty 2

## 2020-10-07 MED ORDER — CHLORHEXIDINE GLUCONATE 0.12 % MT SOLN
15.0000 mL | Freq: Once | OROMUCOSAL | Status: AC
  Administered 2020-10-07: 15 mL via OROMUCOSAL
  Filled 2020-10-07: qty 15

## 2020-10-07 MED ORDER — 0.9 % SODIUM CHLORIDE (POUR BTL) OPTIME
TOPICAL | Status: DC | PRN
  Administered 2020-10-07: 1000 mL

## 2020-10-07 MED ORDER — LIDOCAINE 2% (20 MG/ML) 5 ML SYRINGE
INTRAMUSCULAR | Status: DC | PRN
  Administered 2020-10-07: 80 mg via INTRAVENOUS

## 2020-10-07 MED ORDER — EPHEDRINE 5 MG/ML INJ
INTRAVENOUS | Status: AC
  Filled 2020-10-07: qty 20

## 2020-10-07 MED ORDER — PROMETHAZINE HCL 25 MG/ML IJ SOLN: 6.2500 mg | INTRAMUSCULAR | Status: DC | PRN

## 2020-10-07 MED ORDER — SUCCINYLCHOLINE CHLORIDE 200 MG/10ML IV SOSY
PREFILLED_SYRINGE | INTRAVENOUS | Status: AC
  Filled 2020-10-07: qty 10

## 2020-10-07 MED ORDER — ONDANSETRON 4 MG PO TBDP: 4.0000 mg | ORAL_TABLET | Freq: Three times a day (TID) | ORAL | 0 refills | Status: AC | PRN

## 2020-10-07 MED ORDER — TRAMADOL HCL 50 MG PO TABS
50.0000 mg | ORAL_TABLET | Freq: Once | ORAL | Status: DC | PRN
Start: 2020-10-07 — End: 2020-10-07

## 2020-10-07 MED ORDER — ORAL CARE MOUTH RINSE: 15.0000 mL | Freq: Once | OROMUCOSAL | Status: AC

## 2020-10-07 MED ORDER — MIDAZOLAM HCL 2 MG/2ML IJ SOLN
INTRAMUSCULAR | Status: AC
  Filled 2020-10-07: qty 2

## 2020-10-07 MED ORDER — LACTATED RINGERS IV SOLN: INTRAVENOUS | Status: DC | PRN

## 2020-10-07 MED ORDER — AMISULPRIDE (ANTIEMETIC) 5 MG/2ML IV SOLN: 10.0000 mg | Freq: Once | INTRAVENOUS | Status: DC | PRN

## 2020-10-07 MED ORDER — PHENYLEPHRINE HCL (PRESSORS) 10 MG/ML IV SOLN
INTRAVENOUS | Status: DC | PRN
  Administered 2020-10-07 (×5): 80 ug via INTRAVENOUS

## 2020-10-07 MED ORDER — DEXAMETHASONE SODIUM PHOSPHATE 10 MG/ML IJ SOLN
INTRAMUSCULAR | Status: AC
  Filled 2020-10-07: qty 1

## 2020-10-07 MED ORDER — LACTATED RINGERS IV SOLN: INTRAVENOUS | Status: DC

## 2020-10-07 MED ORDER — ESMOLOL HCL 100 MG/10ML IV SOLN
INTRAVENOUS | Status: AC
  Filled 2020-10-07: qty 10

## 2020-10-07 MED ORDER — FENTANYL CITRATE (PF) 100 MCG/2ML IJ SOLN
INTRAMUSCULAR | Status: AC
  Administered 2020-10-07: 50 ug via INTRAVENOUS
  Filled 2020-10-07: qty 2

## 2020-10-07 MED ORDER — DEXAMETHASONE SODIUM PHOSPHATE 10 MG/ML IJ SOLN
INTRAMUSCULAR | Status: AC
  Filled 2020-10-07: qty 2

## 2020-10-07 MED ORDER — HYDROCODONE-ACETAMINOPHEN 5-325 MG PO TABS: 1.0000 | ORAL_TABLET | Freq: Three times a day (TID) | ORAL | 0 refills | Status: AC | PRN

## 2020-10-07 MED ORDER — FENTANYL CITRATE (PF) 250 MCG/5ML IJ SOLN
INTRAMUSCULAR | Status: AC
  Filled 2020-10-07: qty 5

## 2020-10-07 SURGICAL SUPPLY — 61 items
BANDAGE ESMARK 6X9 LF (GAUZE/BANDAGES/DRESSINGS) ×1 IMPLANT
BNDG COHESIVE 6X5 TAN STRL LF (GAUZE/BANDAGES/DRESSINGS) ×2 IMPLANT
BNDG ELASTIC 4X5.8 VLCR STR LF (GAUZE/BANDAGES/DRESSINGS) ×2 IMPLANT
BNDG ELASTIC 6X5.8 VLCR STR LF (GAUZE/BANDAGES/DRESSINGS) ×2 IMPLANT
BNDG ESMARK 6X9 LF (GAUZE/BANDAGES/DRESSINGS) ×2
BNDG GAUZE ELAST 4 BULKY (GAUZE/BANDAGES/DRESSINGS) ×4 IMPLANT
BRUSH SCRUB EZ PLAIN DRY (MISCELLANEOUS) ×4 IMPLANT
COVER SURGICAL LIGHT HANDLE (MISCELLANEOUS) ×4 IMPLANT
COVER WAND RF STERILE (DRAPES) ×2 IMPLANT
CUFF TOURN SGL QUICK 18X4 (TOURNIQUET CUFF) IMPLANT
CUFF TOURN SGL QUICK 24 (TOURNIQUET CUFF)
CUFF TOURN SGL QUICK 34 (TOURNIQUET CUFF)
CUFF TRNQT CYL 24X4X16.5-23 (TOURNIQUET CUFF) IMPLANT
CUFF TRNQT CYL 34X4.125X (TOURNIQUET CUFF) IMPLANT
DRAPE C-ARM 42X72 X-RAY (DRAPES) IMPLANT
DRAPE C-ARMOR (DRAPES) ×2 IMPLANT
DRAPE U-SHAPE 47X51 STRL (DRAPES) ×2 IMPLANT
DRSG ADAPTIC 3X8 NADH LF (GAUZE/BANDAGES/DRESSINGS) ×2 IMPLANT
DRSG PAD ABDOMINAL 8X10 ST (GAUZE/BANDAGES/DRESSINGS) ×2 IMPLANT
ELECT REM PT RETURN 9FT ADLT (ELECTROSURGICAL) ×2
ELECTRODE REM PT RTRN 9FT ADLT (ELECTROSURGICAL) ×1 IMPLANT
GAUZE SPONGE 4X4 12PLY STRL (GAUZE/BANDAGES/DRESSINGS) ×2 IMPLANT
GLOVE BIO SURGEON STRL SZ7.5 (GLOVE) ×2 IMPLANT
GLOVE BIO SURGEON STRL SZ8 (GLOVE) ×2 IMPLANT
GLOVE BIOGEL PI IND STRL 7.5 (GLOVE) ×1 IMPLANT
GLOVE BIOGEL PI INDICATOR 7.5 (GLOVE) ×1
GLOVE SRG 8 PF TXTR STRL LF DI (GLOVE) ×1 IMPLANT
GLOVE SURG UNDER POLY LF SZ8 (GLOVE) ×1
GOWN STRL REUS W/ TWL LRG LVL3 (GOWN DISPOSABLE) ×2 IMPLANT
GOWN STRL REUS W/ TWL XL LVL3 (GOWN DISPOSABLE) ×1 IMPLANT
GOWN STRL REUS W/TWL LRG LVL3 (GOWN DISPOSABLE) ×2
GOWN STRL REUS W/TWL XL LVL3 (GOWN DISPOSABLE) ×1
KIT BASIN OR (CUSTOM PROCEDURE TRAY) ×2 IMPLANT
KIT TURNOVER KIT B (KITS) ×2 IMPLANT
MANIFOLD NEPTUNE II (INSTRUMENTS) ×2 IMPLANT
NEEDLE 22X1 1/2 (OR ONLY) (NEEDLE) IMPLANT
NS IRRIG 1000ML POUR BTL (IV SOLUTION) ×2 IMPLANT
PACK ORTHO EXTREMITY (CUSTOM PROCEDURE TRAY) ×2 IMPLANT
PAD ARMBOARD 7.5X6 YLW CONV (MISCELLANEOUS) ×4 IMPLANT
PAD CAST 4YDX4 CTTN HI CHSV (CAST SUPPLIES) ×1 IMPLANT
PADDING CAST COTTON 4X4 STRL (CAST SUPPLIES) ×1
PADDING CAST COTTON 6X4 STRL (CAST SUPPLIES) ×2 IMPLANT
SPONGE LAP 18X18 RF (DISPOSABLE) ×2 IMPLANT
STAPLER VISISTAT 35W (STAPLE) IMPLANT
STOCKINETTE IMPERVIOUS LG (DRAPES) ×2 IMPLANT
STRIP CLOSURE SKIN 1/2X4 (GAUZE/BANDAGES/DRESSINGS) IMPLANT
SUCTION FRAZIER HANDLE 10FR (MISCELLANEOUS)
SUCTION TUBE FRAZIER 10FR DISP (MISCELLANEOUS) IMPLANT
SUT ETHILON 3 0 PS 1 (SUTURE) ×4 IMPLANT
SUT PDS AB 2-0 CT1 27 (SUTURE) IMPLANT
SUT VIC AB 0 CT1 27 (SUTURE)
SUT VIC AB 0 CT1 27XBRD ANBCTR (SUTURE) IMPLANT
SUT VIC AB 2-0 CT1 27 (SUTURE)
SUT VIC AB 2-0 CT1 TAPERPNT 27 (SUTURE) IMPLANT
SYR CONTROL 10ML LL (SYRINGE) IMPLANT
TOWEL GREEN STERILE (TOWEL DISPOSABLE) ×4 IMPLANT
TOWEL GREEN STERILE FF (TOWEL DISPOSABLE) ×4 IMPLANT
TUBE CONNECTING 12X1/4 (SUCTIONS) ×2 IMPLANT
UNDERPAD 30X36 HEAVY ABSORB (UNDERPADS AND DIAPERS) ×2 IMPLANT
WATER STERILE IRR 1000ML POUR (IV SOLUTION) ×4 IMPLANT
YANKAUER SUCT BULB TIP NO VENT (SUCTIONS) ×2 IMPLANT

## 2020-10-07 NOTE — Anesthesia Preprocedure Evaluation (Signed)
Anesthesia Evaluation  Patient identified by MRN, date of birth, ID band Patient awake    Reviewed: Allergy & Precautions, H&P , NPO status , Patient's Chart, lab work & pertinent test results  Airway Mallampati: II  TM Distance: >3 FB Neck ROM: Full    Dental no notable dental hx.    Pulmonary neg pulmonary ROS,    Pulmonary exam normal breath sounds clear to auscultation       Cardiovascular negative cardio ROS Normal cardiovascular exam Rhythm:Regular Rate:Normal     Neuro/Psych negative neurological ROS  negative psych ROS   GI/Hepatic negative GI ROS, Neg liver ROS,   Endo/Other  Hypothyroidism   Renal/GU negative Renal ROS  negative genitourinary   Musculoskeletal negative musculoskeletal ROS (+)   Abdominal   Peds negative pediatric ROS (+)  Hematology negative hematology ROS (+)   Anesthesia Other Findings   Reproductive/Obstetrics negative OB ROS                             Anesthesia Physical Anesthesia Plan  ASA: II  Anesthesia Plan: General   Post-op Pain Management:    Induction: Intravenous  PONV Risk Score and Plan: 3 and Midazolam, Ondansetron and Treatment may vary due to age or medical condition  Airway Management Planned: LMA  Additional Equipment: None  Intra-op Plan:   Post-operative Plan: Extubation in OR  Informed Consent: I have reviewed the patients History and Physical, chart, labs and discussed the procedure including the risks, benefits and alternatives for the proposed anesthesia with the patient or authorized representative who has indicated his/her understanding and acceptance.     Dental advisory given  Plan Discussed with: CRNA, Anesthesiologist and Surgeon  Anesthesia Plan Comments:         Anesthesia Quick Evaluation

## 2020-10-07 NOTE — Anesthesia Procedure Notes (Signed)
Procedure Name: LMA Insertion Date/Time: 10/07/2020 11:20 AM Performed by: Adria Dill, CRNA Pre-anesthesia Checklist: Patient identified, Emergency Drugs available, Suction available and Patient being monitored Patient Re-evaluated:Patient Re-evaluated prior to induction Oxygen Delivery Method: Circle system utilized Preoxygenation: Pre-oxygenation with 100% oxygen Induction Type: IV induction LMA: LMA inserted LMA Size: 4.0 Number of attempts: 1 Airway Equipment and Method: Oral airway Placement Confirmation: positive ETCO2 and breath sounds checked- equal and bilateral Tube secured with: Tape Dental Injury: Teeth and Oropharynx as per pre-operative assessment

## 2020-10-07 NOTE — Anesthesia Postprocedure Evaluation (Signed)
Anesthesia Post Note  Patient: Regina Edwards  Procedure(s) Performed: HARDWARE REMOVAL KNEE (Left )     Patient location during evaluation: PACU Anesthesia Type: General Level of consciousness: awake Pain management: pain level controlled Vital Signs Assessment: post-procedure vital signs reviewed and stable Respiratory status: spontaneous breathing and respiratory function stable Cardiovascular status: stable Postop Assessment: no apparent nausea or vomiting Anesthetic complications: no   No complications documented.  Last Vitals:  Vitals:   10/07/20 1305 10/07/20 1320  BP: 114/65 117/62  Pulse: 63 76  Resp: (!) 8 20  Temp:  (!) 36.1 C  SpO2: 100% 100%    Last Pain:  Vitals:   10/07/20 1320  TempSrc:   PainSc: 3                  Candra R Davian Wollenberg

## 2020-10-07 NOTE — Transfer of Care (Signed)
Immediate Anesthesia Transfer of Care Note  Patient: Regina Edwards  Procedure(s) Performed: HARDWARE REMOVAL KNEE (Left )  Patient Location: PACU  Anesthesia Type:General  Level of Consciousness: drowsy and patient cooperative  Airway & Oxygen Therapy: Patient Spontanous Breathing and Patient connected to face mask oxygen  Post-op Assessment: Report given to RN and Post -op Vital signs reviewed and stable  Post vital signs: Reviewed and stable  Last Vitals:  Vitals Value Taken Time  BP 119/69 10/07/20 1235  Temp 36.1 C 10/07/20 1235  Pulse 72 10/07/20 1236  Resp 9 10/07/20 1236  SpO2 100 % 10/07/20 1236  Vitals shown include unvalidated device data.  Last Pain:  Vitals:   10/07/20 1235  TempSrc:   PainSc: 0-No pain      Patients Stated Pain Goal: 0 (10/07/20 0753)  Complications: No complications documented.

## 2020-10-07 NOTE — Discharge Instructions (Addendum)
[facility] Discharge Instructions   General Discharge Instructions   WEIGHT BEARING STATUS: Weightbearing as tolerated Left leg   RANGE OF MOTION/ACTIVITY: unrestricted range of motion left knee, activity as tolerated   Wound Care: daily wound care starting on 10/10/2020. See below   Discharge Wound Care Instructions  Do NOT apply any ointments, solutions or lotions to pin sites or surgical wounds.  These prevent needed drainage and even though solutions like hydrogen peroxide kill bacteria, they also damage cells lining the pin sites that help fight infection.  Applying lotions or ointments can keep the wounds moist and can cause them to breakdown and open up as well. This can increase the risk for infection. When in doubt call the office.  Surgical incisions should be dressed daily.  If any drainage is noted, use one layer of adaptic, then gauze, Kerlix, and an ace wrap.  Once the incision is completely dry and without drainage, it may be left open to air out.  Showering may begin 36-48 hours later.  Cleaning gently with soap and water.   Diet: as you were eating previously.  Can use over the counter stool softeners and bowel preparations, such as Miralax, to help with bowel movements.  Narcotics can be constipating.  Be sure to drink plenty of fluids  PAIN MEDICATION USE AND EXPECTATIONS  You have likely been given narcotic medications to help control your pain.  After a traumatic event that results in an fracture (broken bone) with or without surgery, it is ok to use narcotic pain medications to help control one's pain.  We understand that everyone responds to pain differently and each individual patient will be evaluated on a regular basis for the continued need for narcotic medications. Ideally, narcotic medication use should last no more than 6-8 weeks (coinciding with fracture healing).   As a patient it is your responsibility as well to monitor narcotic  medication use and report the amount and frequency you use these medications when you come to your office visit.   We would also advise that if you are using narcotic medications, you should take a dose prior to therapy to maximize you participation.  IF YOU ARE ON NARCOTIC MEDICATIONS IT IS NOT PERMISSIBLE TO OPERATE A MOTOR VEHICLE (MOTORCYCLE/CAR/TRUCK/MOPED) OR HEAVY MACHINERY DO NOT MIX NARCOTICS WITH OTHER CNS (CENTRAL NERVOUS SYSTEM) DEPRESSANTS SUCH AS ALCOHOL   STOP SMOKING OR USING NICOTINE PRODUCTS!!!!  As discussed nicotine severely impairs your body's ability to heal surgical and traumatic wounds but also impairs bone healing.  Wounds and bone heal by forming microscopic blood vessels (angiogenesis) and nicotine is a vasoconstrictor (essentially, shrinks blood vessels).  Therefore, if vasoconstriction occurs to these microscopic blood vessels they essentially disappear and are unable to deliver necessary nutrients to the healing tissue.  This is one modifiable factor that you can do to dramatically increase your chances of healing your injury.    (This means no smoking, no nicotine gum, patches, etc)  DO NOT USE NONSTEROIDAL ANTI-INFLAMMATORY DRUGS (NSAID'S)  Using products such as Advil (ibuprofen), Aleve (naproxen), Motrin (ibuprofen) for additional pain control during fracture healing can delay and/or prevent the healing response.  If you would like to take over the counter (OTC) medication, Tylenol (acetaminophen) is ok.  However, some narcotic medications that are given for pain control contain acetaminophen as well. Therefore, you should not exceed more than 4000 mg of tylenol in a day if you do not have liver disease.  Also note that  there are may OTC medicines, such as cold medicines and allergy medicines that my contain tylenol as well.  If you have any questions about medications and/or interactions please ask your doctor/PA or your pharmacist.      ICE AND ELEVATE  INJURED/OPERATIVE EXTREMITY  Using ice and elevating the injured extremity above your heart can help with swelling and pain control.  Icing in a pulsatile fashion, such as 20 minutes on and 20 minutes off, can be followed.    Do not place ice directly on skin. Make sure there is a barrier between to skin and the ice pack.    Using frozen items such as frozen peas works well as the conform nicely to the are that needs to be iced.  USE AN ACE WRAP OR TED HOSE FOR SWELLING CONTROL  In addition to icing and elevation, Ace wraps or TED hose are used to help limit and resolve swelling.  It is recommended to use Ace wraps or TED hose until you are informed to stop.    When using Ace Wraps start the wrapping distally (farthest away from the body) and wrap proximally (closer to the body)   Example: If you had surgery on your leg or thing and you do not have a splint on, start the ace wrap at the toes and work your way up to the thigh        If you had surgery on your upper extremity and do not have a splint on, start the ace wrap at your fingers and work your way up to the upper arm  IF YOU ARE IN A SPLINT OR CAST DO NOT REMOVE IT FOR ANY REASON   If your splint gets wet for any reason please contact the office immediately. You may shower in your splint or cast as long as you keep it dry.  This can be done by wrapping in a cast cover or garbage back (or similar)  Do Not stick any thing down your splint or cast such as pencils, money, or hangers to try and scratch yourself with.  If you feel itchy take benadryl as prescribed on the bottle for itching  IF YOU ARE IN A CAM BOOT (BLACK BOOT)  You may remove boot periodically. Perform daily dressing changes as noted below.  Wash the liner of the boot regularly and wear a sock when wearing the boot. It is recommended that you sleep in the boot until told otherwise    Call office for the following:  Temperature greater than 101F  Persistent nausea and  vomiting  Severe uncontrolled pain  Redness, tenderness, or signs of infection (pain, swelling, redness, odor or green/yellow discharge around the site)  Difficulty breathing, headache or visual disturbances  Hives  Persistent dizziness or light-headedness  Extreme fatigue  Any other questions or concerns you may have after discharge  In an emergency, call 911 or go to an Emergency Department at a nearby hospital  HELPFUL INFORMATION  ? If you had a block, it will wear off between 8-24 hrs postop typically.  This is period when your pain may go from nearly zero to the pain you would have had postop without the block.  This is an abrupt transition but nothing dangerous is happening.  You may take an extra dose of narcotic when this happens.  ? You should wean off your narcotic medicines as soon as you are able.  Most patients will be off or using minimal narcotics before their first  postop appointment.   ? We suggest you use the pain medication the first night prior to going to bed, in order to ease any pain when the anesthesia wears off. You should avoid taking pain medications on an empty stomach as it will make you nauseous.  ? Do not drink alcoholic beverages or take illicit drugs when taking pain medications.  ? In most states it is against the law to drive while you are in a splint or sling.  And certainly against the law to drive while taking narcotics.  ? You may return to work/school in the next couple of days when you feel up to it.   ? Pain medication may make you constipated.  Below are a few solutions to try in this order: - Decrease the amount of pain medication if you aren't having pain. - Drink lots of decaffeinated fluids. - Drink prune juice and/or each dried prunes  o If the first 3 don't work start with additional solutions - Take Colace - an over-the-counter stool softener - Take Senokot - an over-the-counter laxative - Take Miralax - a stronger  over-the-counter laxative     CALL THE OFFICE WITH ANY QUESTIONS OR CONCERNS: [phone]   VISIT OUR WEBSITE FOR ADDITIONAL INFORMATION: [email or web address]

## 2020-10-08 ENCOUNTER — Encounter (HOSPITAL_COMMUNITY): Payer: Self-pay | Admitting: Orthopedic Surgery

## 2020-10-11 NOTE — Op Note (Signed)
10/07/2020  4:18 PM  PATIENT:  Regina Edwards  45 y.o. female  PRE-OPERATIVE DIAGNOSIS:  SYMPTOMATIC HARDWARE LT KNEE  POST-OPERATIVE DIAGNOSIS:  SYMPTOMATIC HARDWARE LT KNEE  PROCEDURE:  Procedure(s): HARDWARE REMOVAL KNEE (Left)  SURGEON:  Surgeon(s) and Role:    Myrene Galas, MD - Primary  PHYSICIAN ASSISTANT: None  ANESTHESIA:   general  I/O:  No intake/output data recorded.  SPECIMEN:  No Specimen  TOURNIQUET:  * No tourniquets in log *  COMPLICATIONS: NONE  DICTATION: .Note written in EPIC  DISPOSITION: TO PACU  CONDITION: STABLE  DELAY START OF DVT PROPHYLAXIS BECAUSE OF BLEEDING RISK: NO  BRIEF SUMMARY OF INDICATION FOR PROCEDURE:  Patient is a pleasant 45 y.o. who underwent plate fixation of a left plateau fracture with subsequent healing. Despite conservative measures, hardware related symptoms have persisted. I discussed with her and her husband the risks and benefits of surgical removal including infection, nerve or vessel injury, failure to alleviate symptoms, occult nonunion, re-fracture, DVT, PE, and multiple others. They did wish to proceed.  BRIEF SUMMARY OF PROCEDURE:  The patient was taken to the operating room after administration of 2 g of Ancef.  General anesthesia was induced. The left lower extremity was prepped and draped in usual sterile fashion.  No tourniquet was used during the procedure.  C-arm was brought in to confirm position of the hardware.  I remade the old distal incision and dissected sharply down to the plate, elevating the soft tissues. I identified and removed all screws.  I placed a Cobb over top of the plate and underneath with the sharp edge away from the periosteum to generate some mobility as there were small areas of bone overgrowth and extensive soft tissue connections down to the plate.  The plate was gently rocked to assist with this and then extracted atraumatically. The wounds were irrigated thoroughly and closed in  standard fashion with vicryl and nylon. A sterile gently compressive dressing was applied.  The patient was taken to the PACU in stable condition.  PROGNOSIS: Patient will be weightbearing as tolerated with aggressive active and passive motion of the knee and ankle. Bleeding would be anticipated. She may change or remove his dressing in 48 hours and shower. Patient will follow up in 10 days for removal of sutures.    Doralee Albino. Carola Frost, M.D.

## 2021-08-24 IMAGING — DX DG CHEST 1V PORT
1 series · 1 of 1 positions shown · non-contrast
Comparison: 04/14/2019

CLINICAL DATA: Recent trauma, check endotracheal tube placement

EXAM:
PORTABLE CHEST 1 VIEW

[chest ap]
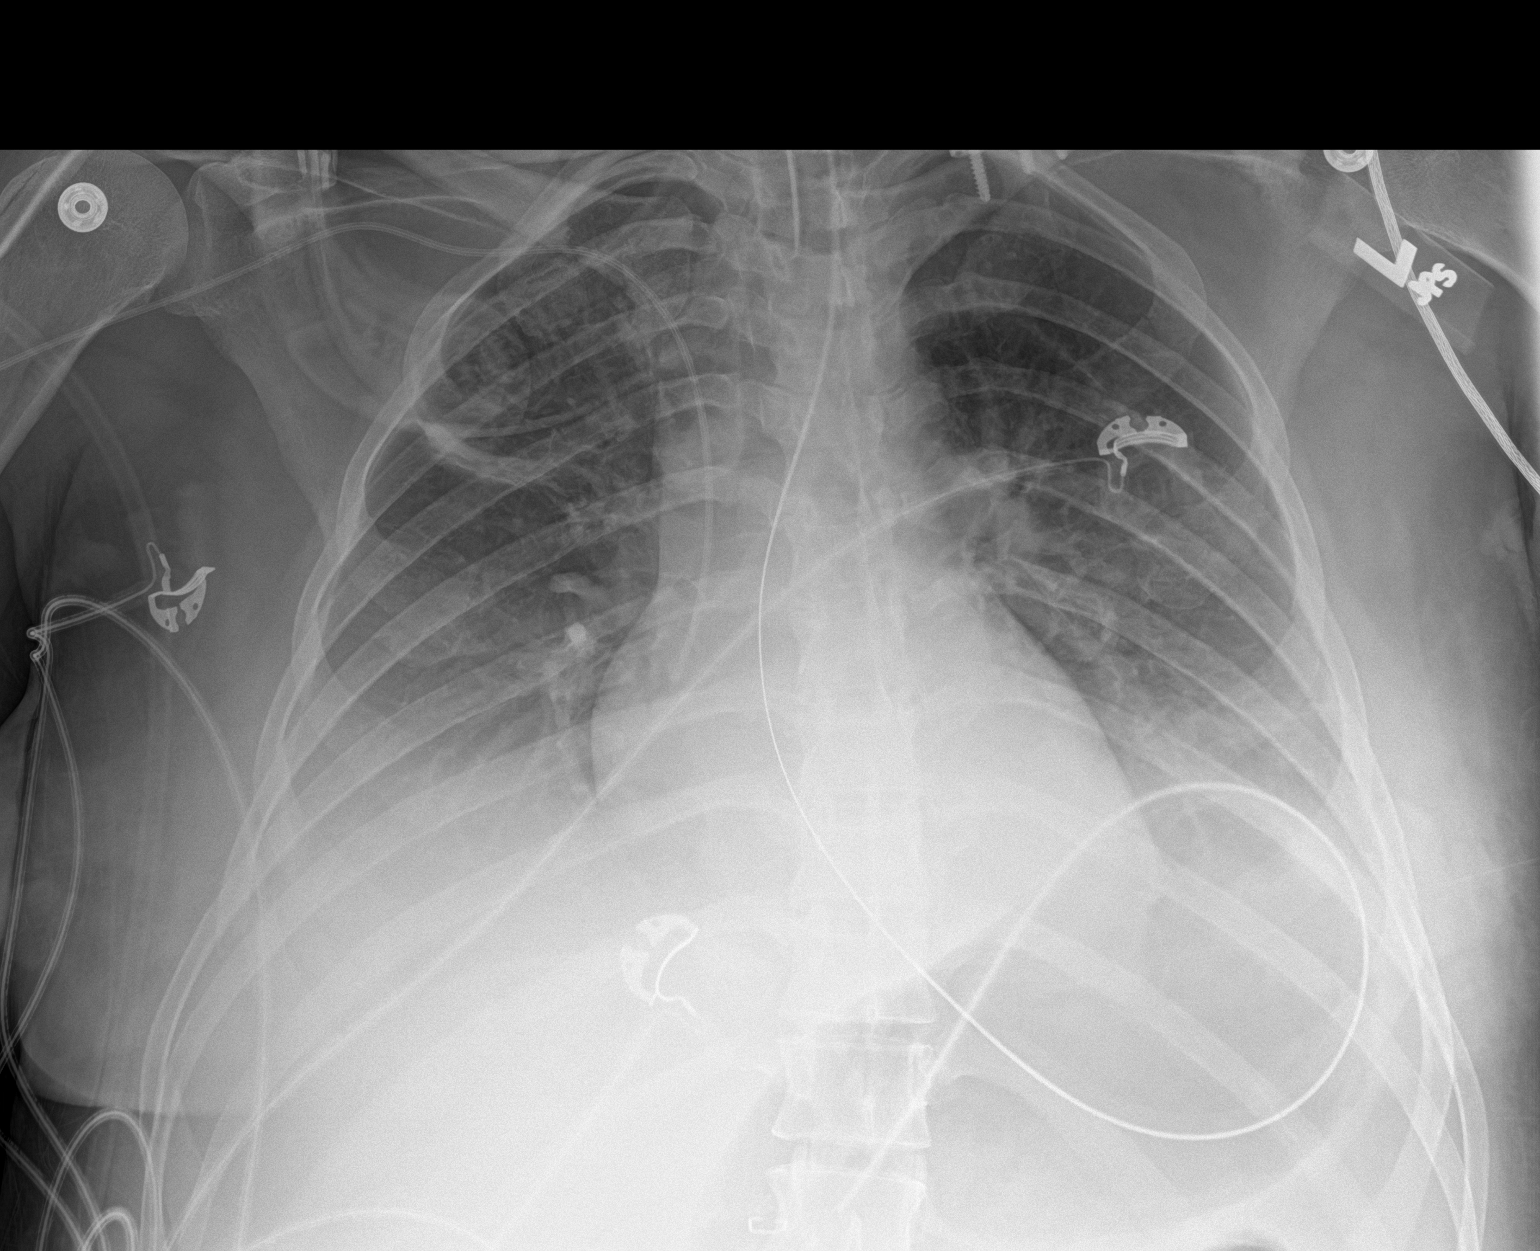

[1 of 1 positions shown; findings below may reference images not displayed]

FINDINGS: New right-sided PICC line is noted with the catheter tip in the
superior aspect of the right atrium. Endotracheal tube and gastric
catheter are noted in satisfactory position. Changes of recent left
clavicular fixation are seen. Rib fractures are noted on the right
without complicating factors. Small effusions are noted bilaterally.
IMPRESSION: Small effusions bilaterally.

New PICC line on the right in satisfactory position.

## 2021-08-27 IMAGING — DX DG CHEST 1V PORT
1 series · 1 of 1 positions shown · non-contrast
Comparison: 04/16/2019

CLINICAL DATA: Leukocytosis.

EXAM:
PORTABLE CHEST 1 VIEW

[chest ap]
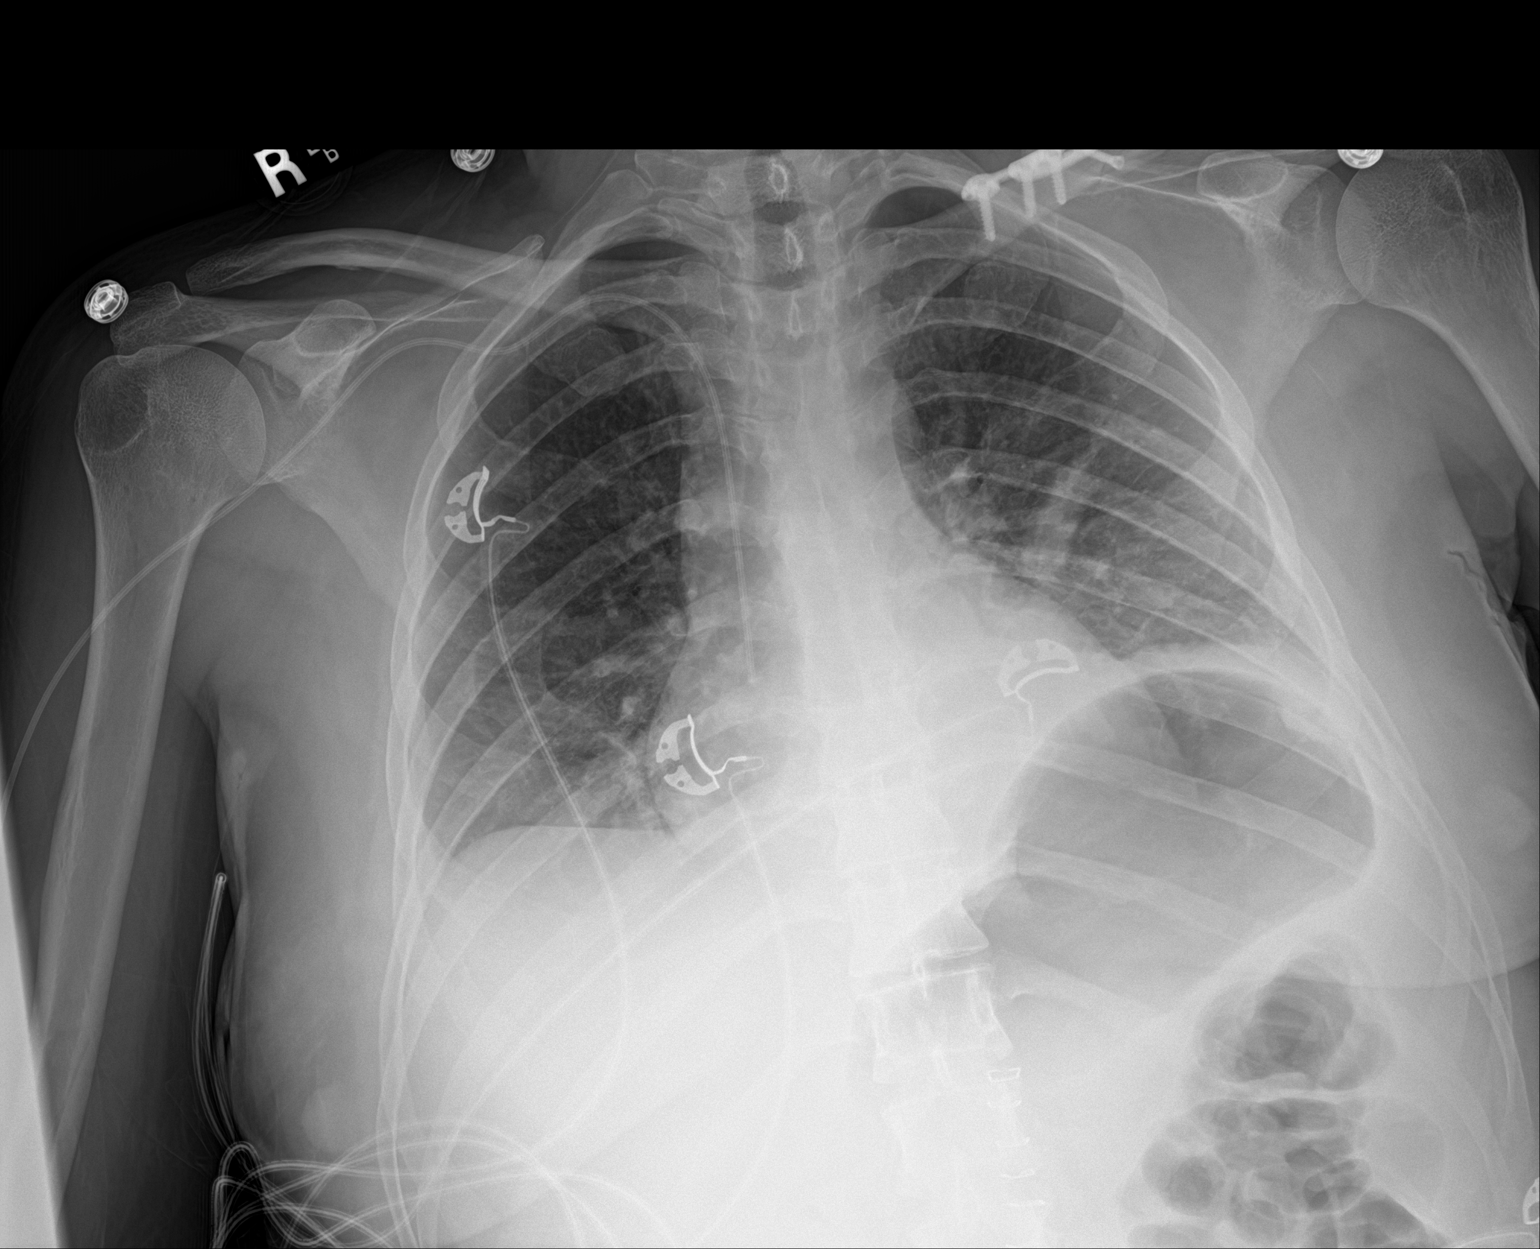

[1 of 1 positions shown; findings below may reference images not displayed]

FINDINGS: Right-sided PICC line unchanged. Lungs are somewhat hypoinflated
with stable mild left base opacification likely small effusion and
atelectasis, although infection is possible. Right lung is clear.
Cardiomediastinal silhouette and remainder of the exam is unchanged.
IMPRESSION: Persistent left base opacification likely small effusion with
atelectasis although infection is possible.

Right PICC line unchanged.
